# Patient Record
Sex: Male | Born: 1970 | Race: White | Hispanic: No | Marital: Married | Smoking: Former smoker
Health system: Southern US, Community
[De-identification: ages and names within clinical notes are randomized; demographics above are authoritative.]

## PROBLEM LIST (undated history)

## (undated) DIAGNOSIS — K603 Anal fistula, unspecified: Secondary | ICD-10-CM

## (undated) DIAGNOSIS — G4733 Obstructive sleep apnea (adult) (pediatric): Secondary | ICD-10-CM

## (undated) DIAGNOSIS — M1990 Unspecified osteoarthritis, unspecified site: Secondary | ICD-10-CM

## (undated) DIAGNOSIS — D751 Secondary polycythemia: Secondary | ICD-10-CM

## (undated) DIAGNOSIS — E291 Testicular hypofunction: Secondary | ICD-10-CM

## (undated) DIAGNOSIS — Z8719 Personal history of other diseases of the digestive system: Secondary | ICD-10-CM

## (undated) DIAGNOSIS — F419 Anxiety disorder, unspecified: Secondary | ICD-10-CM

## (undated) DIAGNOSIS — Z8711 Personal history of peptic ulcer disease: Secondary | ICD-10-CM

## (undated) DIAGNOSIS — K219 Gastro-esophageal reflux disease without esophagitis: Secondary | ICD-10-CM

## (undated) HISTORY — DX: Gastro-esophageal reflux disease without esophagitis: K21.9

## (undated) HISTORY — PX: NASAL SINUS SURGERY: SHX719

## (undated) HISTORY — PX: KNEE ARTHROSCOPY: SUR90

---

## 1999-12-10 ENCOUNTER — Ambulatory Visit (HOSPITAL_COMMUNITY): Admission: RE | Admit: 1999-12-10 | Discharge: 1999-12-10 | Payer: Self-pay | Admitting: Internal Medicine

## 1999-12-10 ENCOUNTER — Encounter: Payer: Self-pay | Admitting: Internal Medicine

## 1999-12-11 ENCOUNTER — Emergency Department (HOSPITAL_COMMUNITY): Admission: EM | Admit: 1999-12-11 | Discharge: 1999-12-11 | Payer: Self-pay | Admitting: *Deleted

## 2003-01-01 ENCOUNTER — Encounter: Admission: RE | Admit: 2003-01-01 | Discharge: 2003-01-01 | Payer: Self-pay | Admitting: Internal Medicine

## 2003-01-01 ENCOUNTER — Encounter: Payer: Self-pay | Admitting: Internal Medicine

## 2003-05-31 ENCOUNTER — Ambulatory Visit (HOSPITAL_COMMUNITY): Admission: RE | Admit: 2003-05-31 | Discharge: 2003-05-31 | Payer: Self-pay | Admitting: Internal Medicine

## 2003-11-29 ENCOUNTER — Encounter: Admission: RE | Admit: 2003-11-29 | Discharge: 2003-11-29 | Payer: Self-pay | Admitting: Internal Medicine

## 2004-02-03 ENCOUNTER — Encounter: Admission: RE | Admit: 2004-02-03 | Discharge: 2004-02-03 | Payer: Self-pay | Admitting: Internal Medicine

## 2004-05-22 ENCOUNTER — Ambulatory Visit (HOSPITAL_COMMUNITY): Admission: RE | Admit: 2004-05-22 | Discharge: 2004-05-22 | Payer: Self-pay | Admitting: Otolaryngology

## 2004-05-22 ENCOUNTER — Ambulatory Visit (HOSPITAL_BASED_OUTPATIENT_CLINIC_OR_DEPARTMENT_OTHER): Admission: RE | Admit: 2004-05-22 | Discharge: 2004-05-22 | Payer: Self-pay | Admitting: Otolaryngology

## 2004-05-22 ENCOUNTER — Encounter (INDEPENDENT_AMBULATORY_CARE_PROVIDER_SITE_OTHER): Payer: Self-pay | Admitting: Specialist

## 2005-04-27 ENCOUNTER — Emergency Department (HOSPITAL_COMMUNITY): Admission: EM | Admit: 2005-04-27 | Discharge: 2005-04-27 | Payer: Self-pay | Admitting: Emergency Medicine

## 2008-02-09 ENCOUNTER — Ambulatory Visit (HOSPITAL_COMMUNITY): Admission: RE | Admit: 2008-02-09 | Discharge: 2008-02-10 | Payer: Self-pay | Admitting: Otolaryngology

## 2008-02-09 ENCOUNTER — Encounter (INDEPENDENT_AMBULATORY_CARE_PROVIDER_SITE_OTHER): Payer: Self-pay | Admitting: Otolaryngology

## 2008-02-09 ENCOUNTER — Encounter: Admission: RE | Admit: 2008-02-09 | Discharge: 2008-02-09 | Payer: Self-pay | Admitting: Otolaryngology

## 2008-02-09 HISTORY — PX: FUNCTIONAL ENDOSCOPIC SINUS SURGERY: SUR616

## 2008-02-25 ENCOUNTER — Emergency Department (HOSPITAL_COMMUNITY): Admission: EM | Admit: 2008-02-25 | Discharge: 2008-02-25 | Payer: Self-pay | Admitting: Emergency Medicine

## 2009-02-21 ENCOUNTER — Encounter: Admission: RE | Admit: 2009-02-21 | Discharge: 2009-02-21 | Payer: Self-pay | Admitting: Internal Medicine

## 2009-05-19 ENCOUNTER — Encounter: Admission: RE | Admit: 2009-05-19 | Discharge: 2009-05-19 | Payer: Self-pay | Admitting: Internal Medicine

## 2009-06-16 ENCOUNTER — Encounter: Admission: RE | Admit: 2009-06-16 | Discharge: 2009-06-16 | Payer: Self-pay | Admitting: Internal Medicine

## 2009-06-17 ENCOUNTER — Encounter: Admission: RE | Admit: 2009-06-17 | Discharge: 2009-06-17 | Payer: Self-pay | Admitting: Internal Medicine

## 2010-01-14 ENCOUNTER — Emergency Department (HOSPITAL_COMMUNITY): Admission: EM | Admit: 2010-01-14 | Discharge: 2010-01-14 | Payer: Self-pay | Admitting: Emergency Medicine

## 2010-01-14 ENCOUNTER — Encounter: Payer: Self-pay | Admitting: Internal Medicine

## 2010-06-11 ENCOUNTER — Encounter (INDEPENDENT_AMBULATORY_CARE_PROVIDER_SITE_OTHER): Payer: Self-pay | Admitting: *Deleted

## 2010-07-28 ENCOUNTER — Ambulatory Visit: Payer: Self-pay | Admitting: Internal Medicine

## 2010-07-28 ENCOUNTER — Encounter (INDEPENDENT_AMBULATORY_CARE_PROVIDER_SITE_OTHER): Payer: Self-pay | Admitting: *Deleted

## 2010-07-28 DIAGNOSIS — R1013 Epigastric pain: Secondary | ICD-10-CM

## 2010-07-28 DIAGNOSIS — K3189 Other diseases of stomach and duodenum: Secondary | ICD-10-CM | POA: Insufficient documentation

## 2010-07-28 DIAGNOSIS — Z8711 Personal history of peptic ulcer disease: Secondary | ICD-10-CM | POA: Insufficient documentation

## 2010-07-28 DIAGNOSIS — R12 Heartburn: Secondary | ICD-10-CM | POA: Insufficient documentation

## 2010-08-06 ENCOUNTER — Ambulatory Visit: Payer: Self-pay | Admitting: Internal Medicine

## 2010-08-10 ENCOUNTER — Telehealth: Payer: Self-pay | Admitting: Internal Medicine

## 2010-11-24 NOTE — Assessment & Plan Note (Signed)
Summary: gas pain, indigestions meds not helping....em   History of Present Illness Visit Type: Initial Consult Primary GI MD: Stan Head MD [facility] Primary Provider: Nila Nephew, MD Requesting Provider: Nila Nephew, MD Chief Complaint: Tanner Hatfield History of Present Illness:   Patient states that he has a hx of gastric ulcers and saw a GI MD in the hospital in 1991, but none since. He states that he has had an increase in his reflux mostly at night while sleeping. He tried Protonix and did not feel like it did anything but cause more bloating and gas. Patient is currently wearing a heart moniter for 30 days, Dr Viann Fish.   "I have had ulcers since teens". Upper Gi series used to diagnose. he was treated with H2 blockers and then took H. pylori therapy in 20's. did well with no meds x years. Then in recent times 3-4 years intermittent Zantac OTC was used, helping upset stomach symptoms and gnawing ache in anbdomen. He was retested for H. pylori but titer not highe enough. Protonx tried with not much help and led to blating and gas. He stopped it.   GI Review of Systems    Reports acid reflux.      Denies abdominal pain, belching, bloating, chest pain, dysphagia with liquids, dysphagia with solids, heartburn, loss of appetite, nausea, vomiting, vomiting blood, weight loss, and  weight gain.        Denies anal fissure, black tarry stools, change in bowel habit, constipation, diarrhea, diverticulosis, fecal incontinence, heme positive stool, hemorrhoids, irritable bowel syndrome, jaundice, light color stool, liver problems, rectal bleeding, and  rectal pain. Preventive Screening-Counseling & Management  Alcohol-Tobacco     Smoking Status: quit    Current Medications (verified): 1)  Zantac 75 75 Mg Tabs (Ranitidine Hcl) .... Take One By Mouth As Needed 2)  Klonopin 0.5 Mg Tabs (Clonazepam) .... Take One By Mouth Two Times A Day 3)  Zyrtec Allergy 10 Mg Tabs (Cetirizine Hcl) .... Take One By  Mouth As Needed 4)  Depo-Testosterone 200 Mg/ml Oil (Testosterone Cypionate) .... 2cc Intramuscular Every 2 Weeks  Allergies (verified): No Known Drug Allergies  Past History:  Past Medical History: Anxiety Disorder Arrhythmia Hemorrhoids Hyperlipidemia Gastric ulcer H. 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  Alcohol Use - yes Daily Caffeine Use 1 per week Smoking Status:  quit  Review of Systems       The patient complains of allergy/sinus and anxiety-new.         All other ROS negative except as per HPI.   Vital Signs:  Patient profile:   40 year old male Height:      73.5 inches Weight:      214.8 pounds BMI:     28.06 Pulse rate:   88 / minute Pulse rhythm:   regular BP sitting:   122 / 78  (left arm) Cuff size:   regular  Vitals Entered By: Harlow Mares CMA Duncan Dull) (July 28, 2010 3:30 PM)  Physical Exam  General:  Well developed, well nourished, no acute distress. Eyes:  PERRLA, no icterus. Mouth:  No deformity or lesions, dentition normal. Neck:  Supple; no masses or  thyromegaly. Lungs:  Clear throughout to auscultation. Heart:  Regular rate and rhythm; no murmurs, rubs,  or bruits. Abdomen:  Soft, nontender and nondistended. No masses, hepatosplenomegaly or hernias noted. Normal bowel sounds. Extremities:  No clubbing, cyanosis, edema or deformities noted. Neurologic:  Alert and  oriented x3 Psych:  Alert and cooperative. Normal mood and affect.   Impression & Recommendations:  Problem # 1:  DYSPEPSIA (ICD-536.8) Assessment New ? PUD, gastritis, reflux, functional GI problem (i.e. he may have  non-ulcer dyspepsia) Has tried multiple PPI's as well as H2 blockers sounds like treatment for H. pylori serology helped at one point, recent subsequent titers negative per patient Orders: EGD (EGD) Risks, benefits,and indications of endoscopic procedure(s) were reviewed with the patient and all questions answered. assessment of mucosa with possible biopsies will help sort out and determine Tx  Problem # 2:  HEARTBURN (ICD-787.1) Assessment: New Some reflux, it seems. Not classic, however.  Orders: EGD (EGD)  Problem # 3:  GASTRIC ULCER, HX OF (ICD-V12.71) Assessment: New  Problem # 4:  HELICOBACTER PYLORI INFECTION, HX OF (ICD-V12.71) Assessment: New  Patient Instructions: 1)  We will see you at your procedure on 08/06/10. 2)  [facility] Patient Information Guide given to patient.  3)  Upper Endoscopy brochure given.  4)  Copy sent to : Nila Nephew, MD 5)  The medication list was reviewed and reconciled.  All changed / newly prescribed medications were explained.  A complete medication list was provided to the patient / caregiver.

## 2010-11-24 NOTE — Consult Note (Signed)
Summary: Rectal Pain    NAME:  Tanner Hatfield, Tanner Hatfield              ACCOUNT NO.:  [id number]      MEDICAL RECORD NO.:  [id number]          PATIENT TYPE:  EMS      LOCATION:  ED                           FACILITY:  [facility]      PHYSICIAN:  Lorne Skeens. Hoxworth, M.D.DATE OF BIRTH:  02-05-1971      DATE OF CONSULTATION:  01/14/2010   DATE OF DISCHARGE:                                    CONSULTATION      CHIEF COMPLAINT:  Rectal pain.      HISTORY:  I was asked by Dr. Elmore Guise to evaluate Mr. Raine.  He is a   generally healthier 40 year old male who presents with approximately 5   days of anorectal pain.  The pain was apparently gradual in onset   without any particular event associated with the onset.  He describes   burning and sharp pain in his anus and rectum which he indicates is more   posterior than anterior.  The pain has been waxing and waning over 5   days although is always present and gets severe at times.  It is worse   with bowel movements and with straining such as lifting or urination.   He has not noted any bleeding.  He thinks there may have been a little   discharge at times, but he is not sure of this.  The patient does have a   history of thrombosed hemorrhoid some years ago but no chronic anorectal   complaints.  No abdominal pain.  No nausea, vomiting.  No fever or   chills.  He was seen in Dr. Thomasene Lot office today who was concerned over   possible rectal abscess, and the patient was seen in the emergency room   for evaluation.      PAST MEDICAL HISTORY:   1. The patient has some history of dyspepsia and reflux.   2. Also some anxiety and panic attacks.   3. Seasonal allergies.      MEDICATIONS:   1. Klonopin 0.25 twice daily.   2. Testosterone once a week.   3. Zyrtec D.      ALLERGIES:  NO KNOWN DRUG ALLERGIES.      SOCIAL HISTORY:  He is married.  Works as a Psychologist, forensic.   He does not smoke cigarettes or drink alcohol.      PHYSICAL  EXAMINATION:  VITAL SIGNS:  He is afebrile, heart rate 86,   blood pressure 141/94, respirations 18.  GENERAL:  Well-developed male   who does not appear in any acute distress.  Pertinent findings on the   abdomen and rectal exam:   ABDOMEN:  Soft, nontender.  No masses or hernias.   RECTAL:  There are no external abnormalities.  Specifically no erythema,   swelling or drainage.  No external hemorrhoids.  On external exam, there   is some tenderness posteriorly.  Again, no induration.  Digital rectal   exam:  He is tender posteriorly.  I do not feel any mass, induration or   fluctuance.  No blood on examining finger.      LABORATORY DATA:  CBC in the emergency room shows a white count of 8000   with normal differential.  Hemoglobin 18.3, platelet count 216.      CT scan of the pelvis and rectum were obtained from the emergency room.   This is negative for any evidence of abscess, inflammatory change or   other abnormality.      ASSESSMENT/PLAN:  A 40 year old with 5 days of persistent rectal pain.   I find no evidence for abscess on physical exam nor is there any   evidence of infection in terms of fever, elevated white count and his CT   is normal.  He does not have bleeding typical for fissure, but I think   anal fissure is the most likely working diagnosis at this point.  I am   going to treat him with diltiazem cream as well as pain medications and   then topical anesthetic.  He is to call me for any worsening symptoms.   Otherwise, I will follow him in the office in 1 week.  If he worsens or   fails to improve, he will need an exam under anesthesia.               Lorne Skeens. Hoxworth, M.D.            Tory Emerald  D:  01/14/2010  T:  01/14/2010  Job:  [id number]      cc:   Erskine Speed, M.D.   Fax: [phone]      Electronically Signed by Glenna Fellows M.D. on 01/16/2010 09:53:35 AM

## 2010-11-24 NOTE — Letter (Signed)
Summary: EGD Instructions  [facility]  [address] [place], [place] [address]   Phone: [phone]  Fax: [phone]       Tanner Hatfield    January 08, 1971    MRN: [id number]      Procedure Day Dorna Bloom: Lenor Coffin, 08/06/10     Arrival Time: 1:30 PM     Procedure Time: 2:30 PM     Location of Procedure:                    _X_ [facility] (4th Floor)  PREPARATION FOR ENDOSCOPY   On THURSDAY, 08/06/10, THE DAY OF THE PROCEDURE:  1.   No solid foods, milk or milk products are allowed after midnight the night before your procedure.  2.   Do not drink anything colored red or purple.  Avoid juices with pulp.  No orange juice.  3.  You may drink clear liquids until 12:30 PM, which is 2 hours before your procedure.                                                                                                CLEAR LIQUIDS INCLUDE: Water Jello Ice Popsicles Tea (sugar ok, no milk/cream) Powdered fruit flavored drinks Coffee (sugar ok, no milk/cream) Gatorade Juice: apple, white grape, white cranberry  Lemonade Clear bullion, consomm, broth Carbonated beverages (any kind) Strained chicken noodle soup Hard Candy   MEDICATION INSTRUCTIONS  Unless otherwise instructed, you should take regular prescription medications with a small sip of water as early as possible the morning of your procedure.                  OTHER INSTRUCTIONS  You will need a responsible adult at least 39 years of age to accompany you and drive you home.   This person must remain in the waiting room during your procedure.  Wear loose fitting clothing that is easily removed.  Leave jewelry and other valuables at home.  However, you may wish to bring a book to read or an iPod/MP3 player to listen to music as you wait for your procedure to start.  Remove all body piercing jewelry and leave at home.  Total time from sign-in until discharge is approximately 2-3  hours.  You should go home directly after your procedure and rest.  You can resume normal activities the day after your procedure.  The day of your procedure you should not:   Drive   Make legal decisions   Operate machinery   Drink alcohol   Return to work  You will receive specific instructions about eating, activities and medications before you leave.    The above instructions have been reviewed and explained to me by   _______________________    I fully understand and can verbalize these instructions _____________________________ Date _________

## 2010-11-24 NOTE — Miscellaneous (Signed)
Summary: Samples Dexilant  Clinical Lists Changes  Medications: Added new medication of DEXILANT 60 MG CPDR (DEXLANSOPRAZOLE) take one by mouth once daily 

## 2010-11-24 NOTE — Progress Notes (Signed)
Summary: CLO negative  Phone Note Outgoing Call   Summary of Call: Let him no H. pylori is negative ask how Dexilant is helping or not? Iva Boop MD, [facility]  August 10, 2010 6:26 AM   Follow-up for Phone Call        LM to [facility] at home number (which is cell) Francee Piccolo CMA Duncan Dull)  August 11, 2010 10:02 AM   Advised pt of Neg H Pylori results.  Pt states Dexilant worked well.  He ran out of samples yesterday.  Do you want to send in RX? Follow-up by: Francee Piccolo CMA Duncan Dull),  August 11, 2010 3:08 PM  Additional Follow-up for Phone Call Additional follow up Details #1::        yes 60 mg daily #30 11 refills get him a discount card also follow-up GI as needed cc Dr. Elmore Guise on this Additional Follow-up by: Iva Boop MD, Clement[facility] Graham,  August 12, 2010 9:04 AM    Additional Follow-up for Phone Call Additional follow up Details #2::    rx sent.  discount card mailed to pt. Follow-up by: Francee Piccolo CMA Duncan Dull),  August 12, 2010 1:11 PM  New/Updated Medications: DEXILANT 60 MG CPDR (DEXLANSOPRAZOLE) take one by mouth once daily Prescriptions: DEXILANT 60 MG CPDR (DEXLANSOPRAZOLE) take one by mouth once daily  #30 x 11   Entered by:   Francee Piccolo CMA (AAMA)   Authorized by:   Iva Boop MD, [facility]   Signed by:   Francee Piccolo CMA (AAMA) on 08/12/2010   Method used:   Electronically to        [facility] (retail)       [address]       [place], [place]  [address]       Ph: [phone]       Fax: [phone]   RxID:   [id number]

## 2010-11-24 NOTE — Miscellaneous (Signed)
Summary: clotest  Clinical Lists Changes  Orders: Added new Test order of TLB-H Pylori Screen Gastric Biopsy (83013-CLOTEST) - Signed 

## 2010-11-24 NOTE — Letter (Signed)
Summary: New Patient letter  [facility]  [address] [place], [place] [address]   Phone: [phone]  Fax: [phone]       06/11/2010 MRN: [id number]  Tanner Hatfield [address] [place], [place]  [address]  Dear Tanner Hatfield,  Welcome to the Gastroenterology Division at [facility].    You are scheduled to see Dr.  Stan Head on July 28, 2010 at 3:00pm on the 3rd floor at [facility], [address]  We ask that you try to arrive at our office 15 minutes prior to your appointment time to allow for check-in.  We would like you to complete the enclosed self-administered evaluation form prior to your visit and bring it with you on the day of your appointment.  We will review it with you.  Also, please bring a complete list of all your medications or, if you prefer, bring the medication bottles and we will list them.  Please bring your insurance card so that we may make a copy of it.  If your insurance requires a referral to see a specialist, please bring your referral form from your primary care physician.  Co-payments are due at the time of your visit and may be paid by cash, check or credit card.     Your office visit will consist of a consult with your physician (includes a physical exam), any laboratory testing he/she may order, scheduling of any necessary diagnostic testing (e.g. x-ray, ultrasound, CT-scan), and scheduling of a procedure (e.g. Endoscopy, Colonoscopy) if required.  Please allow enough time on your schedule to allow for any/all of these possibilities.    If you cannot keep your appointment, please call [phone] to cancel or reschedule prior to your appointment date.  This allows [place] the opportunity to schedule an appointment for another patient in need of care.  If you do not cancel or reschedule by 5 p.m. the business day prior to your appointment date, you will be charged a $50.00 late cancellation/no-show fee.    Thank you for  choosing [facility] for your medical needs.  We appreciate the opportunity to care for you.  Please visit [place] at our website  to learn more about our practice.                     Sincerely,                                                             The Gastroenterology Division

## 2010-11-24 NOTE — Procedures (Signed)
Summary: Upper Endoscopy: Normal and No H. pylori  Patient: Tanner Hatfield Note: All result statuses are Final unless otherwise noted.  Tests: (1) Upper Endoscopy (EGD)   EGD Upper Endoscopy       DONE (C)     [facility]     [address]     [place], [place]  [address]           ENDOSCOPY PROCEDURE REPORT           PATIENT:  Tanner Hatfield, Tanner Hatfield  MR#:  [id number]     BIRTHDATE:  August 06, 1971, 39 yrs. old  GENDER:  male           ENDOSCOPIST:  Iva Boop, MD, [facility]     Referred by:  Nila Nephew, M.D.           PROCEDURE DATE:  08/06/2010     PROCEDURE:  EGD with biopsy     ASA CLASS:  Class I     INDICATIONS:  dyspepsia, heartburn prior H. pylori and history of     peptic ulcer disease           MEDICATIONS:   Fentanyl 50 mcg IV, Versed 8 mg IV, Benadryl 12.5     mg IV 2 mg Versed and bendaryl were premeds in prep area     TOPICAL ANESTHETIC:  Exactacain Spray           DESCRIPTION OF PROCEDURE:   After the risks benefits and     alternatives of the procedure were thoroughly explained, informed     consent was obtained.  The LB [id number] endoscope was     introduced through the mouth and advanced to the second portion of     the duodenum, without limitations.  The instrument was slowly     withdrawn as the mucosa was fully examined.     <<PROCEDUREIMAGES>>           The upper, middle, and distal third of the esophagus were     carefully inspected and no abnormalities were noted. The z-line     was well seen at the GEJ. The endoscope was pushed into the fundus     which was normal including a retroflexed view. The antrum,gastric     body, first and second part of the duodenum were unremarkable.  A     biopsy for H. pylori was taken.    Retroflexed views revealed no     abnormalities.    The scope was then withdrawn from the patient     and the procedure completed.           COMPLICATIONS:  None           ENDOSCOPIC IMPRESSION:     1) Normal EGD - suspect  non-ulcer dyspepsia.     RECOMMENDATIONS:     1) Await H. pylori biopsy results     2) Treat H. pylori if +.     3) May need deep sedation with next procedure. Significant     pre-procedure anxiety.           REPEAT EXAM:  as needed           ADDENDUM: Will start Dexilant 60 mg daily. Having abdominal pain     in recovery but same as pain in past and abdomen is soft and     benign. I think there is an underlying anxiety component to his     problems.  Iva Boop, MD, [facility] Graham           CC:  Nila Nephew, MD     Viann Fish, MD     The Patient           n.     REVISED:  08/06/2010 03:35 PM     eSIGNED:   Iva Boop at 08/06/2010 03:35 PM           Birdena Crandall, [id number]  Note: An exclamation mark (!) indicates a result that was not dispersed into the flowsheet. Document Creation Date: 08/06/2010 3:35 PM _______________________________________________________________________  (1) Order result status: Final Collection or observation date-time: 08/06/2010 15:01 Requested date-time:  Receipt date-time:  Reported date-time:  Referring Physician:   Ordering Physician: Stan Head [phone]) Specimen Source:  Source: Launa Grill Order Number: [id number] Lab site:   Appended Document: Upper Endoscopy NORMAL H. Pylori CLO negative   EGD  Procedure date:  08/06/2010  Findings:      1) Normal EGD - suspect non-ulcer dyspepsia.     RECOMMENDATIONS:     1) Await H. pylori biopsy results NEGATIVE     2) Treat H. pylori if +.     3) May need deep sedation with next procedure. Significant     pre-procedure anxiety.    4) trial of Dexilant 60 mg once daily

## 2010-12-20 ENCOUNTER — Emergency Department (HOSPITAL_COMMUNITY)
Admission: EM | Admit: 2010-12-20 | Discharge: 2010-12-20 | Disposition: A | Discharge status: Home / Self Care | Payer: 59 | Attending: Emergency Medicine | Admitting: Emergency Medicine

## 2010-12-20 ENCOUNTER — Emergency Department (HOSPITAL_COMMUNITY): Payer: 59

## 2010-12-20 DIAGNOSIS — M549 Dorsalgia, unspecified: Secondary | ICD-10-CM | POA: Insufficient documentation

## 2010-12-20 DIAGNOSIS — J90 Pleural effusion, not elsewhere classified: Secondary | ICD-10-CM | POA: Insufficient documentation

## 2010-12-20 DIAGNOSIS — E785 Hyperlipidemia, unspecified: Secondary | ICD-10-CM | POA: Insufficient documentation

## 2010-12-20 DIAGNOSIS — K449 Diaphragmatic hernia without obstruction or gangrene: Secondary | ICD-10-CM | POA: Insufficient documentation

## 2010-12-20 DIAGNOSIS — R112 Nausea with vomiting, unspecified: Secondary | ICD-10-CM | POA: Insufficient documentation

## 2010-12-20 DIAGNOSIS — R1031 Right lower quadrant pain: Secondary | ICD-10-CM | POA: Insufficient documentation

## 2010-12-20 LAB — COMPREHENSIVE METABOLIC PANEL
Alkaline Phosphatase: 29 U/L — ABNORMAL LOW (ref 39–117)
CO2: 26 mEq/L (ref 19–32)
GFR calc Af Amer: >60 mL/min (ref >60)
Glucose, Bld: 95 mg/dL (ref 70–99)
Potassium: 4 mEq/L (ref 3.5–5.1)
Sodium: 133 mEq/L — ABNORMAL LOW (ref 135–145)

## 2010-12-20 LAB — DIFFERENTIAL
Basophils Absolute: 0 10*3/uL (ref 0.0–0.1)
Basophils Relative: 0 % (ref 0–1)
Eosinophils Relative: 0 % (ref 0–5)
Monocytes Absolute: 0.6 10*3/uL (ref 0.1–1.0)
Monocytes Relative: 6 % (ref 3–12)
Neutrophils Relative %: 82 % — ABNORMAL HIGH (ref 43–77)

## 2010-12-20 LAB — URINALYSIS, ROUTINE W REFLEX MICROSCOPIC
Nitrite: NEGATIVE
Protein, ur: NEGATIVE mg/dL
Specific Gravity, Urine: 1.011 (ref 1.005–1.030)
Urobilinogen, UA: 0.2 mg/dL (ref 0.0–1.0)
pH: 7 (ref 5.0–8.0)

## 2010-12-20 LAB — CBC
HCT: 56.9 % — ABNORMAL HIGH (ref 39.0–52.0)
Hemoglobin: 19.9 g/dL — ABNORMAL HIGH (ref 13.0–17.0)
MCH: 30.9 pg (ref 26.0–34.0)
MCHC: 35 g/dL (ref 30.0–36.0)
MCV: 88.5 fL (ref 78.0–100.0)

## 2010-12-20 LAB — LIPASE, BLOOD: Lipase: 40 U/L (ref 11–59)

## 2010-12-20 MED ORDER — IOHEXOL 300 MG/ML  SOLN
100.0000 mL | Freq: Once | INTRAMUSCULAR | Status: AC | PRN
  Administered 2010-12-20: 100 mL via INTRAVENOUS

## 2010-12-24 ENCOUNTER — Ambulatory Visit
Admission: RE | Admit: 2010-12-24 | Discharge: 2010-12-24 | Disposition: A | Discharge status: Home / Self Care | Payer: 59 | Source: Ambulatory Visit | Attending: Family Medicine | Admitting: Family Medicine

## 2010-12-24 ENCOUNTER — Other Ambulatory Visit: Payer: Self-pay | Admitting: Family Medicine

## 2011-01-18 LAB — CBC
HCT: 55.1 % — ABNORMAL HIGH (ref 39.0–52.0)
Platelets: 216 10*3/uL (ref 150–400)
RBC: 5.91 MIL/uL — ABNORMAL HIGH (ref 4.22–5.81)

## 2011-01-22 ENCOUNTER — Encounter (HOSPITAL_COMMUNITY): Payer: 59

## 2011-01-22 ENCOUNTER — Encounter (HOSPITAL_COMMUNITY): Payer: 59 | Attending: Internal Medicine

## 2011-01-22 DIAGNOSIS — D45 Polycythemia vera: Secondary | ICD-10-CM | POA: Insufficient documentation

## 2011-02-12 ENCOUNTER — Encounter (HOSPITAL_COMMUNITY): Payer: 59 | Attending: Internal Medicine

## 2011-02-12 ENCOUNTER — Other Ambulatory Visit: Payer: Self-pay | Admitting: Internal Medicine

## 2011-02-12 DIAGNOSIS — D45 Polycythemia vera: Secondary | ICD-10-CM | POA: Insufficient documentation

## 2011-02-12 LAB — CBC
Hemoglobin: 18.8 g/dL — ABNORMAL HIGH (ref 13.0–17.0)
Platelets: 226 10*3/uL (ref 150–400)
RBC: 5.94 MIL/uL — ABNORMAL HIGH (ref 4.22–5.81)
WBC: 5.9 10*3/uL (ref 4.0–10.5)

## 2011-03-09 NOTE — Op Note (Signed)
NAMECOLM, LYFORD              ACCOUNT NO.:  [id number]   MEDICAL RECORD NO.:  [id number]          PATIENT TYPE:  OIB   LOCATION:  5006                         FACILITY:  [facility]   PHYSICIAN:  Jefry H. 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   PROCEDURE:  Right endoscopic frontal sinusotomy and removal of right  ethmoid mucocele.   SURGEON:  Jefry H. Pollyann Kennedy, MD   ANESTHESIA:  General endotracheal anesthesia was used.   COMPLICATIONS:  None.   ESTIMATED BLOOD LOSS:  Minimal.   FINDINGS:  Large obstructed ethmoid cell with complete obstruction of  the frontal sinus.  The frontal sinus on the right side has a central  portion and a more lateral portion above the orbit.  There are 2  separate drainage pathways for these separated by a bony septum.  Both  of these were obstructed.  The medial portion was filled with thick  purulent material.   HISTORY:  This is a 40 year old gentleman who underwent an extensive  sinus surgery about 3 years ago.  He started having severe pain and  swelling around the right eye yesterday and on imaging, it was found to  have what appeared to be an expansile mass, possibly a mucocele on the  right ethmoid and with extension of disease into the frontal sinuses.  Risks, benefits, alternatives, and complications of the procedure were  explained to the patient.  He understand and agreed to surgery.   PROCEDURE:  The patient was taken to the operating room, placed on the  operating table in supine position.  Following induction of general  endotracheal anesthesia, the patient was prepped and draped in standard  fashion.  Afrin spray was used preoperatively.  The 30  and 70-degree  nasal endoscopes were used for the procedure.  Xylocaine with  epinephrine was infiltrated into the mucosa overlying the ethmoid cell.  A straight suction was then used to open up this cell into a large air  containing space.  The walls of the cell were completely taken down  keeping the lamina papyracea intact.  Further inspection up into the  frontal recess revealed obstructing material, which was also cleaned out  using appropriate angled forceps.  The secondary frontal ostium was  identified and the suction was entered and material was cleaned out from  the sinus.  The openings were enlarged taking care not to damage the  fovea.  Using transillumination, I was able to get very bright  transillumination of the entire frontal sinus in both portions including  the medial portion.  Afrin-soaked pledgets were then placed and removed  just prior to extubation.  The patient was then awakened, extubated, and  transferred to recovery in stable condition.      Jefry H. Pollyann Kennedy, MD  Electronically Signed  JHR/MEDQ  D:  02/09/2008  T:  02/10/2008  Job:  [id number]

## 2011-03-12 NOTE — Op Note (Signed)
NAME:  Tanner Hatfield, Tanner Hatfield                        ACCOUNT NO.:  [id number]   MEDICAL RECORD NO.:  [id number]                   PATIENT TYPE:  AMB   LOCATION:  DSC                                  FACILITY:  [facility]   PHYSICIAN:  Jefry H. Pollyann Kennedy, M.D.                DATE OF BIRTH:  22-May-1971   DATE OF PROCEDURE:  05/22/2004  DATE OF DISCHARGE:                                 OPERATIVE REPORT   PREOPERATIVE DIAGNOSIS:  1. Chronic nasal and sinus polyposis.  2. Chronic frontal sinusitis bilateral.  3. Chronic maxillary sinusitis bilaterally.   POSTOPERATIVE DIAGNOSIS:  1. Chronic nasal and sinus polyposis.  2. Chronic frontal sinusitis bilateral.  3. Chronic maxillary sinusitis bilaterally.   PROCEDURE:  1. Extensive nasal and sinus polypectomy bilateral and endoscopic.  2. Revision bilateral frontal sinusotomy.  3. Revision bilateral maxillary antrostomy with removal of polypoid tissue     and mucous retention cysts from the maxillary sinuses bilaterally.   SURGEON:  Jefry H. Pollyann Kennedy, M.D.   ANESTHESIA:  General endotracheal anesthesia.   COMPLICATIONS:  None.   ESTIMATED BLOOD LOSS:  150 mL.   FINDINGS:  Diffuse nasal and ethmoid sinus polyposis, bilateral maxillary  sinus obstruction with bilateral retention cysts and hyperplastic mucosa  with polypoid degeneration in the maxillary sinuses.  Bilateral frontal  sinus obstruction with purulent secretions found on both sides.   SPECIMENS:  Nasal and sinus contents, and second specimen was frontal sinus  contents for culture and sensitivity.   DISPOSITION:  The patient tolerated the procedure well, was awakened,  extubated, and transferred to the recovery room in stable condition.   HISTORY:  This is a 40 year old who developed new onset chronic sinus  infection this past year and underwent extensive endoscopic sinus surgery  back in April.  He initially did well but then started having chronic  infection with chronic frontal  sinus opacification.  He also had  intermittent swelling of the forehead and around the left eye that was of  unclear etiology.  The risks, benefits, alternatives, and complications of  the procedure were explained to the patient who seemed to understand and  agreed to the surgery.   PROCEDURE:  The patient was taken to the operating room and placed on the  operating table in supine position.  Following induction of general  endotracheal anesthesia, the face was prepped and draped in a standard  fashion.  Oxymetazoline spray was used preoperatively in the nasal cavities.  1% Xylocaine with epinephrine was infiltrated into the superior and  posterior attachments of the middle turbinates and the lateral nasal wall  bilaterally.  A total of about 7 mL was injected.   1. Extensive nasal and sinus polypectomy.  Using the 0 and 30 degree nasal     endoscopes and multiple different sinus forceps as well as the     microdebrider, a complete ethmoid cavity polyp clean out was  performed on     both sides.  Anterior and posterior cells were cleaned of polypoid and     hyperplastic mucosa.  The middle turbinates were near completely resected     bilaterally to allow better access to the ethmoid cavities.  Following     the clean out of the ethmoid cavities and removal of all polypoid tissue,     the attention was turned towards the maxillary sinuses.   1. Bilateral maxillary antrostomy with removal of maxillary sinus tissue.     The antrostomy sites were inspected bilaterally and were completely     filled with polypoid tissue which was cleaned out using the microdebrider     and various sinus forceps.  There was a large mucous cyst on the left     side that was opened and unroofed.  As much hyperplastic mucosa that     could be removed easily was removed from both sides.  There was some     purulent secretions on the left side that were suctioned, as well.   1. Bilateral revision frontal  sinusotomy.  After completing the ethmoid     polypectomy, the frontal recess was explored and cleaned of polypoid     tissue.  A large amount of purulent secretions were produced from the     frontal sinuses  and were collected and sent for culture and sensitivity.     The polypoid tissue was removed from the frontal recess and from within     the sinus, itself, using giraffe forceps.  After adequate opening and     exposure of the frontal sinus was performed, the frontal sinus was packed     with Cortisporin ointment.  The ethmoid and maxillary cavities were also     packed with Cortisporin ointment.  The nasal cavities were packed with     rolled up Telfa.  The pharynx was suctioned of blood and secretions.  The     patient was then awakened, extubated, and transferred to the recovery     room.                                               Jefry H. Pollyann Kennedy, M.D.    JHR/MEDQ  D:  05/22/2004  T:  05/22/2004  Job:  [id number]

## 2011-03-17 ENCOUNTER — Other Ambulatory Visit: Payer: Self-pay | Admitting: Internal Medicine

## 2011-03-17 ENCOUNTER — Encounter (HOSPITAL_COMMUNITY): Payer: 59 | Attending: Internal Medicine

## 2011-03-17 DIAGNOSIS — D45 Polycythemia vera: Secondary | ICD-10-CM | POA: Insufficient documentation

## 2011-03-17 LAB — CBC
HCT: 53.7 % — ABNORMAL HIGH (ref 39.0–52.0)
Hemoglobin: 19.6 g/dL — ABNORMAL HIGH (ref 13.0–17.0)
MCH: 31.9 pg (ref 26.0–34.0)
MCHC: 36.5 g/dL — ABNORMAL HIGH (ref 30.0–36.0)
MCV: 87.3 fL (ref 78.0–100.0)
RBC: 6.15 MIL/uL — ABNORMAL HIGH (ref 4.22–5.81)
RDW: 12.6 % (ref 11.5–15.5)
WBC: 6.7 10*3/uL (ref 4.0–10.5)

## 2011-04-02 ENCOUNTER — Encounter (HOSPITAL_COMMUNITY): Payer: 59 | Attending: Internal Medicine

## 2011-04-02 ENCOUNTER — Other Ambulatory Visit: Payer: Self-pay | Admitting: Internal Medicine

## 2011-04-02 DIAGNOSIS — D45 Polycythemia vera: Secondary | ICD-10-CM | POA: Insufficient documentation

## 2011-04-02 LAB — CBC
Hemoglobin: 17.9 g/dL — ABNORMAL HIGH (ref 13.0–17.0)
MCHC: 37.1 g/dL — ABNORMAL HIGH (ref 30.0–36.0)
MCV: 87.5 fL (ref 78.0–100.0)
Platelets: 227 10*3/uL (ref 150–400)

## 2011-04-02 LAB — DIFFERENTIAL
Basophils Absolute: 0 10*3/uL (ref 0.0–0.1)
Lymphs Abs: 1.8 10*3/uL (ref 0.7–4.0)
Monocytes Absolute: 0.5 10*3/uL (ref 0.1–1.0)
Monocytes Relative: 8 % (ref 3–12)
Neutro Abs: 3.3 10*3/uL (ref 1.7–7.7)
Neutrophils Relative %: 57 % (ref 43–77)

## 2011-04-23 ENCOUNTER — Encounter (HOSPITAL_COMMUNITY): Payer: 59

## 2011-04-23 ENCOUNTER — Other Ambulatory Visit: Payer: Self-pay | Admitting: Internal Medicine

## 2011-04-23 LAB — POCT HEMOGLOBIN-HEMACUE: Hemoglobin: 17 g/dL (ref 13.0–17.0)

## 2011-04-30 ENCOUNTER — Encounter (INDEPENDENT_AMBULATORY_CARE_PROVIDER_SITE_OTHER): Payer: Self-pay | Admitting: General Surgery

## 2011-04-30 ENCOUNTER — Ambulatory Visit (INDEPENDENT_AMBULATORY_CARE_PROVIDER_SITE_OTHER): Payer: Worker's Compensation | Admitting: General Surgery

## 2011-04-30 VITALS — BP 150/106 | HR 98 | Temp 98.1°F | Ht 74.0 in | Wt 211.6 lb

## 2011-04-30 DIAGNOSIS — R1030 Lower abdominal pain, unspecified: Secondary | ICD-10-CM | POA: Insufficient documentation

## 2011-04-30 DIAGNOSIS — R109 Unspecified abdominal pain: Secondary | ICD-10-CM

## 2011-04-30 NOTE — Progress Notes (Signed)
Subjective:     Patient ID: DUTCH ING, male   DOB: [birth year], 40 y.o.   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 Recently he's had the onset of left groin pain. He then got examined by his physician and was noted to also have right groin pain. He has testicular pain is associated with this. He has no other symptoms outside of the pain associated with this. It is made worse with activity. It is relieved with rest. He's had no trouble with his bowel movements or urination. He has no nausea and vomiting associated with it. He was possibly had a hernia and was referred for evaluation.  Review of Systems  Musculoskeletal: Positive for arthralgias.  All other systems reviewed and are negative.       Objective:   Physical Exam  Constitutional: He appears well-developed and well-nourished.  Abdominal: Soft. He exhibits no mass. There is no tenderness. There is no guarding. Hernia confirmed negative in the right inguinal area and confirmed negative in the left inguinal area.  Genitourinary: Testes normal. Right testis shows no mass, no swelling and no tenderness. Left testis shows no mass, no swelling and no tenderness.  Lymphadenopathy:       Right: No inguinal adenopathy present.       Left: No inguinal adenopathy present.       Assessment:     Bilateral groin pain    Plan:       He does not have hernias on either side on his examination. Furthermore his genitourinary examination did not show any source of his pain. I think he may very well have a musculoskeletal origin of his pain and we'll plan on treating that.

## 2011-05-05 ENCOUNTER — Telehealth (INDEPENDENT_AMBULATORY_CARE_PROVIDER_SITE_OTHER): Payer: Self-pay | Admitting: General Surgery

## 2011-07-20 LAB — CBC
Hemoglobin: 18.3 — ABNORMAL HIGH
MCHC: 34.5
RBC: 6.03 — ABNORMAL HIGH
RDW: 12.3

## 2013-02-12 ENCOUNTER — Inpatient Hospital Stay (HOSPITAL_COMMUNITY)
Admission: AD | Admit: 2013-02-12 | Discharge: 2013-02-14 | DRG: 349 | Disposition: A | Discharge status: Home / Self Care | Payer: 59 | Source: Ambulatory Visit | Attending: General Surgery | Admitting: General Surgery

## 2013-02-12 ENCOUNTER — Encounter (INDEPENDENT_AMBULATORY_CARE_PROVIDER_SITE_OTHER): Payer: Self-pay | Admitting: General Surgery

## 2013-02-12 ENCOUNTER — Encounter (INDEPENDENT_AMBULATORY_CARE_PROVIDER_SITE_OTHER): Payer: Self-pay

## 2013-02-12 ENCOUNTER — Encounter (HOSPITAL_COMMUNITY): Payer: Self-pay | Admitting: *Deleted

## 2013-02-12 ENCOUNTER — Ambulatory Visit (INDEPENDENT_AMBULATORY_CARE_PROVIDER_SITE_OTHER): Payer: 59 | Admitting: General Surgery

## 2013-02-12 ENCOUNTER — Other Ambulatory Visit (INDEPENDENT_AMBULATORY_CARE_PROVIDER_SITE_OTHER): Payer: Self-pay | Admitting: General Surgery

## 2013-02-12 VITALS — BP 138/82 | HR 84 | Temp 97.9°F | Resp 18 | Ht 74.0 in | Wt 204.0 lb

## 2013-02-12 DIAGNOSIS — M7710 Lateral epicondylitis, unspecified elbow: Secondary | ICD-10-CM | POA: Diagnosis present

## 2013-02-12 DIAGNOSIS — K612 Anorectal abscess: Secondary | ICD-10-CM

## 2013-02-12 DIAGNOSIS — Z8711 Personal history of peptic ulcer disease: Secondary | ICD-10-CM

## 2013-02-12 DIAGNOSIS — F411 Generalized anxiety disorder: Secondary | ICD-10-CM | POA: Diagnosis present

## 2013-02-12 DIAGNOSIS — K611 Rectal abscess: Secondary | ICD-10-CM

## 2013-02-12 DIAGNOSIS — IMO0002 Reserved for concepts with insufficient information to code with codable children: Secondary | ICD-10-CM

## 2013-02-12 LAB — URINALYSIS, ROUTINE W REFLEX MICROSCOPIC
Glucose, UA: NEGATIVE mg/dL
Hgb urine dipstick: NEGATIVE
Protein, ur: NEGATIVE mg/dL
pH: 7 (ref 5.0–8.0)

## 2013-02-12 LAB — CBC WITH DIFFERENTIAL/PLATELET
Eosinophils Absolute: 0 10*3/uL (ref 0.0–0.7)
Eosinophils Relative: 0 % (ref 0–5)
Hemoglobin: 16.7 g/dL (ref 13.0–17.0)
Lymphs Abs: 1 10*3/uL (ref 0.7–4.0)
MCH: 28.5 pg (ref 26.0–34.0)
MCV: 84.3 fL (ref 78.0–100.0)
Monocytes Relative: 7 % (ref 3–12)
Neutrophils Relative %: 82 % — ABNORMAL HIGH (ref 43–77)
RBC: 5.85 MIL/uL — ABNORMAL HIGH (ref 4.22–5.81)

## 2013-02-12 LAB — COMPREHENSIVE METABOLIC PANEL
AST: 19 U/L (ref 0–37)
Albumin: 3.9 g/dL (ref 3.5–5.2)
Calcium: 9.5 mg/dL (ref 8.4–10.5)
Creatinine, Ser: 1.25 mg/dL (ref 0.50–1.35)

## 2013-02-12 MED ORDER — MORPHINE SULFATE 4 MG/ML IJ SOLN: 2.0000 mg | INTRAMUSCULAR | Status: DC | PRN

## 2013-02-12 MED ORDER — ACETAMINOPHEN 650 MG RE SUPP: 650.0000 mg | Freq: Four times a day (QID) | RECTAL | Status: DC | PRN

## 2013-02-12 MED ORDER — MORPHINE SULFATE 2 MG/ML IJ SOLN
1.0000 mg | INTRAMUSCULAR | Status: DC | PRN
  Administered 2013-02-12 – 2013-02-13 (×3): 2 mg via INTRAVENOUS
  Administered 2013-02-13: 4 mg via INTRAVENOUS
  Administered 2013-02-13 – 2013-02-14 (×3): 2 mg via INTRAVENOUS
  Filled 2013-02-12 (×4): qty 1
  Filled 2013-02-12: qty 2
  Filled 2013-02-12 (×2): qty 1

## 2013-02-12 MED ORDER — OXYCODONE HCL 5 MG PO TABS
5.0000 mg | ORAL_TABLET | ORAL | Status: DC | PRN
  Administered 2013-02-12: 10 mg via ORAL
  Administered 2013-02-14: 5 mg via ORAL
  Administered 2013-02-14: 10 mg via ORAL
  Filled 2013-02-12 (×3): qty 2

## 2013-02-12 MED ORDER — CLONAZEPAM 0.5 MG PO TABS: 0.2500 mg | ORAL_TABLET | Freq: Two times a day (BID) | ORAL | Status: DC | PRN

## 2013-02-12 MED ORDER — PIPERACILLIN-TAZOBACTAM 3.375 G IVPB
3.3750 g | Freq: Three times a day (TID) | INTRAVENOUS | Status: DC
  Administered 2013-02-12 – 2013-02-14 (×6): 3.375 g via INTRAVENOUS
  Filled 2013-02-12 (×8): qty 50

## 2013-02-12 MED ORDER — SODIUM CHLORIDE 0.9 % IV SOLN: 3.0000 g | Freq: Four times a day (QID) | INTRAVENOUS | Status: DC

## 2013-02-12 MED ORDER — DIPHENHYDRAMINE HCL 12.5 MG/5ML PO ELIX: 12.5000 mg | ORAL_SOLUTION | Freq: Four times a day (QID) | ORAL | Status: DC | PRN

## 2013-02-12 MED ORDER — LORAZEPAM 2 MG/ML IJ SOLN: 1.0000 mg | Freq: Four times a day (QID) | INTRAMUSCULAR | Status: DC | PRN

## 2013-02-12 MED ORDER — POTASSIUM CHLORIDE IN NACL 20-0.9 MEQ/L-% IV SOLN
INTRAVENOUS | Status: DC
Start: 2013-02-12 — End: 2013-02-14
  Administered 2013-02-12: 1000 mL via INTRAVENOUS
  Administered 2013-02-13: 14:00:00 via INTRAVENOUS
  Filled 2013-02-12 (×3): qty 1000

## 2013-02-12 MED ORDER — FAMOTIDINE 20 MG PO TABS
20.0000 mg | ORAL_TABLET | Freq: Two times a day (BID) | ORAL | Status: DC
  Administered 2013-02-12 – 2013-02-14 (×4): 20 mg via ORAL
  Filled 2013-02-12 (×5): qty 1

## 2013-02-12 MED ORDER — SODIUM CHLORIDE 0.9 % IV SOLN: INTRAVENOUS | Status: DC

## 2013-02-12 MED ORDER — PIPERACILLIN-TAZOBACTAM 3.375 G IVPB 30 MIN: 3.3750 g | Freq: Three times a day (TID) | INTRAVENOUS | Status: DC

## 2013-02-12 MED ORDER — ONDANSETRON HCL 4 MG/2ML IJ SOLN
4.0000 mg | Freq: Four times a day (QID) | INTRAMUSCULAR | Status: DC | PRN
  Administered 2013-02-13 – 2013-02-14 (×2): 4 mg via INTRAVENOUS
  Filled 2013-02-12 (×2): qty 2

## 2013-02-12 MED ORDER — ACETAMINOPHEN 325 MG PO TABS: 650.0000 mg | ORAL_TABLET | Freq: Four times a day (QID) | ORAL | Status: DC | PRN

## 2013-02-12 MED ORDER — DIPHENHYDRAMINE HCL 50 MG/ML IJ SOLN: 12.5000 mg | Freq: Four times a day (QID) | INTRAMUSCULAR | Status: DC | PRN

## 2013-02-12 MED ORDER — HEPARIN SODIUM (PORCINE) 5000 UNIT/ML IJ SOLN
5000.0000 [IU] | Freq: Three times a day (TID) | INTRAMUSCULAR | Status: DC
  Administered 2013-02-12: 5000 [IU] via SUBCUTANEOUS
  Filled 2013-02-12 (×5): qty 1

## 2013-02-12 MED ORDER — DOCUSATE SODIUM 100 MG PO CAPS
100.0000 mg | ORAL_CAPSULE | Freq: Two times a day (BID) | ORAL | Status: DC
  Administered 2013-02-12 – 2013-02-14 (×3): 100 mg via ORAL
  Filled 2013-02-12 (×5): qty 1

## 2013-02-12 MED ORDER — ZOLPIDEM TARTRATE 5 MG PO TABS: 5.0000 mg | ORAL_TABLET | Freq: Every evening | ORAL | Status: DC | PRN

## 2013-02-12 MED ORDER — ONDANSETRON HCL 4 MG/2ML IJ SOLN: 4.0000 mg | Freq: Four times a day (QID) | INTRAMUSCULAR | Status: DC | PRN

## 2013-02-12 NOTE — Progress Notes (Signed)
Patient refused the 2200 heparin injection.

## 2013-02-12 NOTE — Progress Notes (Signed)
Dr Biagio Quint was notified of patient refusal of the heparin injection

## 2013-02-12 NOTE — Progress Notes (Signed)
Pt c/o pain between buttocks, more so on the Lt side. Appetite good. States had LBM- 4/21 x2. Urine sample obtained

## 2013-02-12 NOTE — Progress Notes (Signed)
Patient ID: Tanner Hatfield, male   DOB: 1971/04/14, 42 y.o.   MRN: [id number]  Chief Complaint  Patient presents with  . Abscess    HPI Tanner Hatfield is a 42 y.o. male.   HPI This is a 42 year old male I know from both his own care as well as his daughters. Since last Tuesday he has had mostly left-sided buttock pain. He is unable to sit. He has a history of an anal fissure as well as an occasional thrombosed hemorrhoid but this is different. This is gotten much worse and he woke up this weekend and felt like he was sitting on a softball. He's had no drainage but has had low-grade fevers at home. He states he has felt hot as well. This did not get any better and he presented to urgent care over the weekend was given antibiotics. He comes in today for no improvement. He is also been  on prednisone for the last 2 weeks due to some elbow tendinitis. He is having bowel movements. He had a full lunch consisting of a barbecue sandwich is drinking water in the room when I see him. Past Medical History  Diagnosis Date  . Ulcer   . GERD (gastroesophageal reflux disease)     Past Surgical History  Procedure Laterality Date  . Knee arthroscopy w/ acl reconstruction      right knee  . Functional endoscopic sinus surgery      scraped sinus    Family History  Problem Relation Age of Onset  . Heart disease Father     afib    Social History History  Substance Use Topics  . Smoking status: Former Games developer  . Smokeless tobacco: Not on file  . Alcohol Use: No    No Known Allergies  Current Outpatient Prescriptions  Medication Sig Dispense Refill  . clindamycin (CLEOCIN) 300 MG capsule Take 300 mg by mouth 3 (three) times daily.      . clonazePAM (KLONOPIN) 0.5 MG tablet Take 0.5 mg by mouth 2 (two) times daily as needed.        Marland Kitchen HYDROcodone-acetaminophen (NORCO/VICODIN) 5-325 MG per tablet Take 2 tablets by mouth every 6 (six) hours as needed for pain.      . metroNIDAZOLE (FLAGYL) 500  MG tablet Take 500 mg by mouth 2 (two) times daily.      . predniSONE (DELTASONE) 10 MG tablet Take 10 mg by mouth daily.       No current facility-administered medications for this visit.    Review of Systems Review of Systems  Constitutional: Positive for fever.  Gastrointestinal: Positive for rectal pain. Negative for diarrhea, constipation, blood in stool and anal bleeding.    Blood pressure 138/82, pulse 84, temperature 97.9 F (36.6 C), temperature source Oral, resp. rate 18, height 6\' 2"  (1.88 m), weight 204 lb (92.534 kg).  Physical Exam Physical Exam  Vitals reviewed. Constitutional: He appears well-developed and well-nourished.  Cardiovascular: Normal rate, regular rhythm and normal heart sounds.   Pulmonary/Chest: Effort normal. He has no wheezes. He has no rales.  Abdominal: Soft. Bowel sounds are normal. There is no tenderness.  Genitourinary: Rectal exam shows tenderness. Rectal exam shows no external hemorrhoid.       Data Reviewed Note from [facility]  Assessment    Perirectal abscess     Plan    It appears that he has perirectal abscess.  He is currently on prednisone and has been having low grade fevers  at home.  On abx but no better.  He will not really let me adequately examine him due to pain. He is drinking water right now and ate a full lunch. I recommended admission, iv abx, check labs and eua with drainage tomorrow.  Will discuss with hospital physician        [facility] 02/12/2013, 2:47 PM

## 2013-02-12 NOTE — H&P (Signed)
Tanner Hatfield is an 42 y.o. male.   Chief Complaint: Rectal pain PCP:  Dr. Nila Nephew  HPI: This is a 42 year old male known to Dr Dwain Sarna from both his own care as well as his daughters. Since last Tuesday he has had mostly left-sided buttock pain. He is unable to sit. He has a history of an anal fissure as well as an occasional thrombosed hemorrhoid but this is different. This is gotten much worse and he woke up this weekend Saturday 4/19, and felt like he was sitting on a softball. He's had no drainage but has had low-grade fevers at home. He states he has felt hot as well. This did not get any better and he presented to Urgent Care over the weekend was given antibiotics. He also spoke to Dr. Johna Sheriff over the phone. He comes in today for no improvement. He is also been on prednisone for the last 2 weeks due to some elbow tendinitis. He is having bowel movements. He had a full lunch consisting of a barbecue sandwich is drinking water in the room our Urgent Office when seen by Dr. Dwain Sarna. He was referred to the hospital for IV antibiotics, and EUA with possible perirectal abscess drainage in the AM.   Past Medical History  Diagnosis Date  . Ulcer   . GERD (gastroesophageal reflux disease)     Past Surgical History  Procedure Laterality Date  . Knee arthroscopy w/ acl reconstruction left knee      right knee arthroscopy at the same time  . Functional endoscopic sinus surgery      scraped sinus    Family History  Problem Relation Age of Onset  . Heart disease Father     afib  Mother has had breast cancer, and sarcoid. 1 Brother in good health works for [employer].  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  Allergies: No Known Allergies  Medications Prior to Admission  Medication Sig Dispense Refill  . clindamycin (CLEOCIN) 300 MG capsule  Take 300 mg by mouth 3 (three) times daily. Started 02/10/13 for 10 day course of therapy.      . clonazePAM (KLONOPIN) 0.5 MG tablet Take 0.25 mg by mouth 2 (two) times daily as needed for anxiety.       Marland Kitchen HYDROcodone-acetaminophen (NORCO/VICODIN) 5-325 MG per tablet Take 2 tablets by mouth every 6 (six) hours as needed for pain.      Marland Kitchen loratadine (CLARITIN) 10 MG tablet Take 10 mg by mouth at bedtime.      . metroNIDAZOLE (FLAGYL) 500 MG tablet Take 500 mg by mouth 2 (two) times daily. 15 day course of therapy started 02/10/13.      . Omega-3 Fatty Acids (FISH OIL) 1200 MG CAPS Take 2 capsules by mouth at bedtime.      . predniSONE (DELTASONE) 10 MG tablet Take 10 mg by mouth daily.      Marland Kitchen testosterone cypionate (DEPOTESTOTERONE CYPIONATE) 100 MG/ML injection Inject 200 mg into the muscle every 7 (seven) days. For IM use only; usually takes Sun, Mon or Tues        No results found for this or any previous visit (from the past 48 hour(s)). No results found.  Review of Systems  Constitutional: Negative.   HENT: Negative.        He's had sinus  surgery what sounds like abscess in the past.  Eyes: Negative.   Respiratory: Negative.   Cardiovascular:       He doesn't have chest pain, but he's had a work up for PAC'S in the past.  Resolved when he stopped taking an antihistamine.  Gastrointestinal: Positive for heartburn (had PUD at age 62, has gotten better.  still has GERD on and off, but can't afford dexilant, take  prilosec or H2 prn). Negative for nausea, vomiting, abdominal pain, diarrhea, constipation, blood in stool and melena.  Genitourinary: Negative.   Musculoskeletal: Negative.   Skin: Negative.        Started having issues 4/15, burning type discomfort, He has long weekend and it was OK till Saturday 4/19, and then it felt like, "sitting on a large baseball."  Neurological: Negative.   Endo/Heme/Allergies: Negative.   Psychiatric/Behavioral: Negative.     Blood pressure 143/82,  pulse 80, temperature 97.8 F (36.6 C), temperature source Oral, resp. rate 18, SpO2 96.00%. Physical Exam  Constitutional: He is oriented to person, place, and time. He appears well-developed and well-nourished. No distress.  Very anxious  HENT:  Head: Normocephalic and atraumatic.  Nose: Nose normal.  Eyes: Conjunctivae and EOM are normal. Pupils are equal, round, and reactive to light. Right eye exhibits no discharge. Left eye exhibits no discharge. No scleral icterus.  Neck: Normal range of motion. Neck supple. No JVD present. No tracheal deviation present. No thyromegaly present.  Cardiovascular: Regular rhythm, normal heart sounds and intact distal pulses.  Exam reveals no gallop.   No murmur heard. Sl tachycardia.  Respiratory: Effort normal and breath sounds normal. No respiratory distress. He has no wheezes. He has no rales. He exhibits no tenderness.  GI: Soft. Bowel sounds are normal. He exhibits no distension and no mass. There is no tenderness. There is no rebound and no guarding.  Genitourinary:  Genitourinary: Rectal exam shows tenderness. Rectal exam shows no external hemorrhoid.           Musculoskeletal: Normal range of motion. He exhibits no edema and no tenderness.  Lymphadenopathy:    He has no cervical adenopathy.  Neurological: He is alert and oriented to person, place, and time. No cranial nerve deficit.  Skin: Skin is warm and dry. He is not diaphoretic.  Psychiatric: He has a normal mood and affect. His behavior is normal. Judgment and thought content normal.  Rather anxious    This portion of exam done in office by Dr. Dwain Sarna   Assessment/Plan 1.  Perirectal abscess left side 2. Hx of anxiety on Klonopin 3.Hx of PUD/GERD 4. Testosterone replacement therapy 5.Tennis elbow, on prednisone.  Plan:  Labs tonight, IV fluids, Zosyn, and plan for surgery in the AM.  Mallory Enriques 02/12/2013, 4:44 PM

## 2013-02-13 ENCOUNTER — Encounter (HOSPITAL_COMMUNITY): Payer: Self-pay | Admitting: Anesthesiology

## 2013-02-13 ENCOUNTER — Encounter (HOSPITAL_COMMUNITY): Admission: AD | Disposition: A | Discharge status: Home / Self Care | Payer: Self-pay | Source: Ambulatory Visit

## 2013-02-13 ENCOUNTER — Inpatient Hospital Stay (HOSPITAL_COMMUNITY): Payer: 59 | Admitting: Anesthesiology

## 2013-02-13 DIAGNOSIS — K612 Anorectal abscess: Secondary | ICD-10-CM

## 2013-02-13 HISTORY — PX: EXAMINATION UNDER ANESTHESIA: SHX1540

## 2013-02-13 LAB — SURGICAL PCR SCREEN: MRSA, PCR: NEGATIVE

## 2013-02-13 SURGERY — EXAM UNDER ANESTHESIA
Anesthesia: General | Site: Rectum | Wound class: Contaminated

## 2013-02-13 MED ORDER — ACETAMINOPHEN 10 MG/ML IV SOLN
1000.0000 mg | Freq: Once | INTRAVENOUS | Status: AC
  Administered 2013-02-13: 1000 mg via INTRAVENOUS

## 2013-02-13 MED ORDER — LIDOCAINE HCL (CARDIAC) 20 MG/ML IV SOLN
INTRAVENOUS | Status: DC | PRN
  Administered 2013-02-13: 100 mg via INTRAVENOUS

## 2013-02-13 MED ORDER — ONDANSETRON HCL 4 MG/2ML IJ SOLN
INTRAMUSCULAR | Status: DC | PRN
  Administered 2013-02-13: 4 mg via INTRAVENOUS

## 2013-02-13 MED ORDER — ENOXAPARIN SODIUM 40 MG/0.4ML ~~LOC~~ SOLN
40.0000 mg | SUBCUTANEOUS | Status: DC
  Filled 2013-02-13: qty 0.4

## 2013-02-13 MED ORDER — PROPOFOL 10 MG/ML IV BOLUS
INTRAVENOUS | Status: DC | PRN
  Administered 2013-02-13: 30 mg via INTRAVENOUS
  Administered 2013-02-13: 20 mg via INTRAVENOUS
  Administered 2013-02-13: 200 mg via INTRAVENOUS

## 2013-02-13 MED ORDER — 0.9 % SODIUM CHLORIDE (POUR BTL) OPTIME
TOPICAL | Status: DC | PRN
  Administered 2013-02-13: 1000 mL

## 2013-02-13 MED ORDER — FENTANYL CITRATE 0.05 MG/ML IJ SOLN
INTRAMUSCULAR | Status: DC | PRN
  Administered 2013-02-13: 100 ug via INTRAVENOUS
  Administered 2013-02-13: 50 ug via INTRAVENOUS

## 2013-02-13 MED ORDER — SODIUM CHLORIDE 0.9 % IJ SOLN
INTRAMUSCULAR | Status: DC | PRN
  Administered 2013-02-13: 20 mL via INTRAVENOUS

## 2013-02-13 MED ORDER — BUPIVACAINE LIPOSOME 1.3 % IJ SUSP
20.0000 mL | Freq: Once | INTRAMUSCULAR | Status: DC
  Filled 2013-02-13: qty 20

## 2013-02-13 MED ORDER — FENTANYL CITRATE 0.05 MG/ML IJ SOLN: 25.0000 ug | INTRAMUSCULAR | Status: DC | PRN

## 2013-02-13 MED ORDER — HYDROCORTISONE SOD SUCCINATE 1000 MG IJ SOLR
INTRAMUSCULAR | Status: DC | PRN
  Administered 2013-02-13: 100 mg via INTRAVENOUS

## 2013-02-13 MED ORDER — LACTATED RINGERS IV SOLN
INTRAVENOUS | Status: DC
  Administered 2013-02-13: 11:00:00 via INTRAVENOUS
  Administered 2013-02-13 (×2): 1000 mL via INTRAVENOUS

## 2013-02-13 MED ORDER — LACTATED RINGERS IV SOLN: INTRAVENOUS | Status: DC

## 2013-02-13 MED ORDER — HYDROMORPHONE HCL PF 1 MG/ML IJ SOLN
1.0000 mg | INTRAMUSCULAR | Status: DC | PRN
  Administered 2013-02-13 (×3): 1 mg via INTRAVENOUS
  Filled 2013-02-13 (×3): qty 1

## 2013-02-13 MED ORDER — MIDAZOLAM HCL 5 MG/5ML IJ SOLN
INTRAMUSCULAR | Status: DC | PRN
  Administered 2013-02-13: 2 mg via INTRAVENOUS

## 2013-02-13 MED ORDER — BUPIVACAINE LIPOSOME 1.3 % IJ SUSP
INTRAMUSCULAR | Status: DC | PRN
  Administered 2013-02-13: 20 mL

## 2013-02-13 SURGICAL SUPPLY — 33 items
BLADE HEX COATED 2.75 (ELECTRODE) ×2 IMPLANT
BLADE SURG 15 STRL LF DISP TIS (BLADE) ×1 IMPLANT
BLADE SURG 15 STRL SS (BLADE) ×1
CLOTH BEACON ORANGE TIMEOUT ST (SAFETY) ×2 IMPLANT
DECANTER SPIKE VIAL GLASS SM (MISCELLANEOUS) ×2 IMPLANT
DRAIN PENROSE 18X1/2 LTX STRL (DRAIN) IMPLANT
DRSG PAD ABDOMINAL 8X10 ST (GAUZE/BANDAGES/DRESSINGS) IMPLANT
ELECT REM PT RETURN 9FT ADLT (ELECTROSURGICAL) ×2
ELECTRODE REM PT RTRN 9FT ADLT (ELECTROSURGICAL) ×1 IMPLANT
GAUZE PACKING IODOFORM 1/4X5 (PACKING) ×2 IMPLANT
GAUZE SPONGE 4X4 16PLY XRAY LF (GAUZE/BANDAGES/DRESSINGS) ×2 IMPLANT
GLOVE BIOGEL PI IND STRL 7.0 (GLOVE) ×1 IMPLANT
GLOVE BIOGEL PI INDICATOR 7.0 (GLOVE) ×1
GOWN STRL NON-REIN LRG LVL3 (GOWN DISPOSABLE) ×2 IMPLANT
GOWN STRL REIN XL XLG (GOWN DISPOSABLE) ×4 IMPLANT
KIT BASIN OR (CUSTOM PROCEDURE TRAY) ×2 IMPLANT
LUBRICANT JELLY K Y 4OZ (MISCELLANEOUS) ×2 IMPLANT
NDL SAFETY ECLIPSE 18X1.5 (NEEDLE) ×1 IMPLANT
NEEDLE HYPO 18GX1.5 SHARP (NEEDLE) ×1
NEEDLE HYPO 22GX1.5 SAFETY (NEEDLE) ×4 IMPLANT
NEEDLE HYPO 25X1 1.5 SAFETY (NEEDLE) ×2 IMPLANT
NS IRRIG 1000ML POUR BTL (IV SOLUTION) ×2 IMPLANT
PACK LITHOTOMY IV (CUSTOM PROCEDURE TRAY) IMPLANT
PENCIL BUTTON HOLSTER BLD 10FT (ELECTRODE) ×2 IMPLANT
SPONGE GAUZE 4X4 12PLY (GAUZE/BANDAGES/DRESSINGS) ×2 IMPLANT
SPONGE SURGIFOAM ABS GEL 12-7 (HEMOSTASIS) IMPLANT
SUT CHROMIC 2 0 SH (SUTURE) IMPLANT
SUT CHROMIC 3 0 SH 27 (SUTURE) IMPLANT
SYR BULB IRRIGATION 50ML (SYRINGE) ×2 IMPLANT
SYR CONTROL 10ML LL (SYRINGE) ×2 IMPLANT
TOWEL OR 17X26 10 PK STRL BLUE (TOWEL DISPOSABLE) ×2 IMPLANT
UNDERPAD 30X30 INCONTINENT (UNDERPADS AND DIAPERS) ×2 IMPLANT
YANKAUER SUCT BULB TIP 10FT TU (MISCELLANEOUS) ×2 IMPLANT

## 2013-02-13 NOTE — Progress Notes (Signed)
  Subjective: His major compliant was having to take heparin.    Objective: Vital signs in last 24 hours: Temp:  [97.7 F (36.5 C)-98.4 F (36.9 C)] 97.7 F (36.5 C) (04/22 0610) Pulse Rate:  [66-84] 68 (04/22 0610) Resp:  [17-18] 18 (04/22 0610) BP: (126-146)/(70-88) 126/70 mmHg (04/22 0610) SpO2:  [94 %-99 %] 97 % (04/22 0610) Weight:  [92.534 kg (204 lb)] 92.534 kg (204 lb) (04/22 0019) Last BM Date: 02/05/13 Afebrile, VSS, labs OK Intake/Output from previous day: 04/21 0701 - 04/22 0700 In: 1374.2 [P.O.:840; I.V.:534.2] Out: 2100 [Urine:2100] Intake/Output this shift:    General appearance: alert, cooperative and no distress Skin: No change.  Lab Results:   Recent Labs  02/12/13 1637  WBC 9.6  HGB 16.7  HCT 49.3  PLT 255    BMET  Recent Labs  02/12/13 1637  NA 138  K 4.5  CL 101  CO2 29  GLUCOSE 98  BUN 14  CREATININE 1.25  CALCIUM 9.5   PT/INR No results found for this basename: LABPROT, INR,  in the last 72 hours   Recent Labs Lab 02/12/13 1637  AST 19  ALT 22  ALKPHOS 42  BILITOT 0.4  PROT 7.4  ALBUMIN 3.9     Lipase     Component Value Date/Time   LIPASE 40 12/20/2010 1619     Studies/Results: No results found.  Medications: . docusate sodium  100 mg Oral BID  . famotidine  20 mg Oral BID  . heparin  5,000 Units Subcutaneous Q8H  . piperacillin-tazobactam (ZOSYN)  IV  3.375 g Intravenous Q8H    Assessment/Plan 1. Perirectal abscess left side  2. Hx of anxiety on Klonopin  3.Hx of PUD/GERD  4. Testosterone replacement therapy  5.Tennis elbow, on prednisone.  Plan:  OR later this AM   LOS: 1 day    Gerardo Caiazzo 02/13/2013

## 2013-02-13 NOTE — Anesthesia Preprocedure Evaluation (Signed)
Anesthesia Evaluation  Patient identified by MRN, date of birth, ID band Patient awake    Reviewed: Allergy & Precautions, H&P , NPO status , Patient's Chart, lab work & pertinent test results  Airway Mallampati: II TM Distance: >3 FB Neck ROM: full    Dental no notable dental hx.    Pulmonary neg pulmonary ROS,  breath sounds clear to auscultation  Pulmonary exam normal       Cardiovascular Exercise Tolerance: Good negative cardio ROS  Rhythm:regular Rate:Normal     Neuro/Psych negative neurological ROS  negative psych ROS   GI/Hepatic negative GI ROS, Neg liver ROS, GERD-  Controlled,  Endo/Other  negative endocrine ROS  Renal/GU negative Renal ROS  negative genitourinary   Musculoskeletal   Abdominal   Peds  Hematology negative hematology ROS (+)   Anesthesia Other Findings   Reproductive/Obstetrics negative OB ROS                           Anesthesia Physical Anesthesia Plan  ASA: II  Anesthesia Plan: General   Post-op Pain Management:    Induction: Intravenous  Airway Management Planned: LMA  Additional Equipment:   Intra-op Plan:   Post-operative Plan:   Informed Consent: I have reviewed the patients History and Physical, chart, labs and discussed the procedure including the risks, benefits and alternatives for the proposed anesthesia with the patient or authorized representative who has indicated his/her understanding and acceptance.   Dental Advisory Given  Plan Discussed with: CRNA and Surgeon  Anesthesia Plan Comments:         Anesthesia Quick Evaluation

## 2013-02-13 NOTE — Interval H&P Note (Signed)
History and Physical Interval Note:  02/13/2013 10:59 AM  Tanner Hatfield  has presented today for surgery, with the diagnosis of Perirectal absecss  The various methods of treatment have been discussed with the patient and family. After consideration of risks, benefits and other options for treatment, the patient has consented to  Procedure(s): with possible Incision and Draniange of Perirectal Abscess (N/A) as a surgical intervention .  The patient's history has been reviewed, patient examined, no change in status, stable for surgery.  I have reviewed the patient's chart and labs.  Questions were answered to the patient's satisfaction.     Salathiel Ferrara A.

## 2013-02-13 NOTE — Care Management Note (Signed)
    Page 1 of 1   02/13/2013     5:00:48 PM   CARE MANAGEMENT NOTE 02/13/2013  Patient:  Tanner Hatfield, Tanner Hatfield   Account Number:  [id number]  Date Initiated:  02/13/2013  Documentation initiated by:  Lorenda Ishihara  Subjective/Objective Assessment:   42 yo male admitted with perirectal abscess, I&D in the OR. PTA lived at home with spouse.     Action/Plan:   Home with spouse.   Anticipated DC Date:  02/16/2013   Anticipated DC Plan:  HOME/SELF CARE      DC Planning Services  CM consult      Choice offered to / List presented to:             Status of service:  Completed, signed off Medicare Important Message given?   (If response is "NO", the following Medicare IM given date fields will be blank) Date Medicare IM given:   Date Additional Medicare IM given:    Discharge Disposition:  HOME/SELF CARE  Per UR Regulation:  Reviewed for med. necessity/level of care/duration of stay  If discussed at Long Length of Stay Meetings, dates discussed:    Comments:

## 2013-02-13 NOTE — Progress Notes (Signed)
OR TODAY.  QUESTIONS ANSWERED.

## 2013-02-13 NOTE — Progress Notes (Signed)
Patient decided to take the scheduled 2200 heparin injection. Pharmacy was notified. Heparin injection given.

## 2013-02-13 NOTE — Transfer of Care (Signed)
Immediate Anesthesia Transfer of Care Note  Patient: Tanner Hatfield  Procedure(s) Performed: Procedure(s):  Incision and Draniange of Perirectal Abscess (N/A)  Patient Location: PACU  Anesthesia Type:General  Level of Consciousness: sedated  Airway & Oxygen Therapy: Patient Spontanous Breathing and Patient connected to face mask oxygen  Post-op Assessment: Report given to PACU RN and Post -op Vital signs reviewed and stable  Post vital signs: Reviewed and stable  Complications: No apparent anesthesia complications

## 2013-02-13 NOTE — H&P (Signed)
OR FOR EUA.  PROBABLE ABSCESS.  DISCUSSED WITH PATIENT.  RISKS OF BLEEDING,  INFECTION,  MORE SURGERY OR INCONTINENCE.

## 2013-02-13 NOTE — Anesthesia Postprocedure Evaluation (Signed)
  Anesthesia Post-op Note  Patient: Tanner Hatfield  Procedure(s) Performed: Procedure(s) (LRB):  Incision and Draniange of Perirectal Abscess (N/A)  Patient Location: PACU  Anesthesia Type: General  Level of Consciousness: awake and alert   Airway and Oxygen Therapy: Patient Spontanous Breathing  Post-op Pain: mild  Post-op Assessment: Post-op Vital signs reviewed, Patient's Cardiovascular Status Stable, Respiratory Function Stable, Patent Airway and No signs of Nausea or vomiting  Last Vitals:  Filed Vitals:   02/13/13 1246  BP: 143/98  Pulse: 72  Temp: 36.9 C  Resp: 16    Post-op Vital Signs: stable   Complications: No apparent anesthesia complications

## 2013-02-13 NOTE — Brief Op Note (Signed)
02/12/2013 - 02/13/2013  11:53 AM  PATIENT:  Tanner Hatfield  42 y.o. male  PRE-OPERATIVE DIAGNOSIS:  Perirectal absecss  POST-OPERATIVE DIAGNOSIS:  PERIRECTAL ABSCESS  PROCEDURE:  Procedure(s):  Incision and Draniange of Perirectal Abscess (N/A)  SURGEON:  Surgeon(s) and Role:    * Mechel Haggard A. Loyda Costin, MD - Primary    ASSISTANTS: none   ANESTHESIA:   local and general  EBL:  Total I/O In: -  Out: 40 [Blood:40]  BLOOD ADMINISTERED:none  DRAINS: none   LOCAL MEDICATIONS USED:  Amount: 40 cc  ml exparel 20  Cc by 20 cc  SPECIMEN:  Source of Specimen:  abscess  DISPOSITION OF SPECIMEN:  micro  COUNTS:  YES   DICTATION: .Other Dictation: Dictation Number [id number]  PLAN OF CARE: Admit to inpatient   PATIENT DISPOSITION:  PACU - hemodynamically stable.   Delay start of Pharmacological VTE agent (>24hrs) due to surgical blood loss or risk of bleeding: no

## 2013-02-14 ENCOUNTER — Encounter (INDEPENDENT_AMBULATORY_CARE_PROVIDER_SITE_OTHER): Payer: Self-pay | Admitting: Internal Medicine

## 2013-02-14 ENCOUNTER — Encounter (HOSPITAL_COMMUNITY): Payer: Self-pay | Admitting: Surgery

## 2013-02-14 MED ORDER — AMOXICILLIN-POT CLAVULANATE 875-125 MG PO TABS: 1.0000 | ORAL_TABLET | Freq: Two times a day (BID) | ORAL | Status: DC

## 2013-02-14 MED ORDER — OXYCODONE HCL 5 MG PO TABS: 5.0000 mg | ORAL_TABLET | ORAL | Status: DC | PRN

## 2013-02-14 NOTE — Op Note (Signed)
NAMEHOVANES, HYMAS NO.:  [id number]  MEDICAL RECORD NO.:  [id number]  LOCATION:  1528                         FACILITY:  [facility]  PHYSICIAN:  Maisie Fus A. Hamzeh Tall, M.D.DATE OF BIRTH:  May 29, 1971  DATE OF PROCEDURE:  02/13/2013 DATE OF DISCHARGE:                              OPERATIVE REPORT   PREOPERATIVE DIAGNOSIS:  Perirectal and perianal pain.  POSTOPERATIVE DIAGNOSIS:  Posterior midline 3-cm perirectal abscess which was intersphincteric.  PROCEDURE:  Incision and drainage of intersphincteric perirectal abscess.  SURGEON:  Maisie Fus A. Valora Norell, M.D.  ANESTHESIA:  LMA with 40 mL of Exparel which is a dilution of 20 mL of Exparel with 20 mL normal saline, as the perianal block.  EBL:  Minimal.  SPECIMEN:  Cultures taken.  INDICATIONS FOR PROCEDURE:  The patient was seen yesterday in our office.  This is a 42 year old male with an 1-week history of perianal pain.  He is too tender to examine in the office.  He was admitted for exam under anesthesia today in the operating room.  I met the patient in holding area.  Discussed the procedure with him and answered all his questions to the best of my ability.  He seems satisfied.  Risk of bleeding, infection, continence, and need for further operation for the fistula formation.  DESCRIPTION OF PROCEDURE:  The patient was then taken from the holding area to the operating room.  He was placed supine where LMA anesthesia was initiated.  He was placed in lithotomy and the anal canal was prepped and draped in sterile fashion.  He was already on Zosyn.  Time- out was done.  Digital examination was done.  This was normal.  The sphincter tone was elevated.  I could not easily palpate a mass, so I took a needle and aspirated posterior to the anus where most of his pain was.  I got a small return and possibly made a small cruciate incision, ending got about 10-15 mL of pus at about 3-cm abscess which was in the posterior  midline and was intersphincteric in between the internal and external sphincter.  I used my finger to open the cavity and hemostat after making a cruciate incision and irrigated out the cavity.  This was packed with quarter-inch iodoform packing and dry dressing was applied. The remainder of the examination was unremarkable.  He did have a small external hemorrhoid, as well, which was thrombosed and took a small scalpel and I went ahead and drain this.  When I did this, there was minimal clot.  This was in the left lateral positions.  The remainder of his examination was normal.  Dry dressings were then applied.  The patient was taken out of lithotomy and taken back to recovery after extubation in satisfactory condition.  All final counts were correct.  EBL:  Less than 100 mL.     Dudley Cooley A. Macyn Remmert, M.D.     TAC/MEDQ  D:  02/13/2013  T:  02/14/2013  Job:  [id number]

## 2013-02-14 NOTE — Discharge Summary (Signed)
  Physician Discharge Summary  Patient ID: Tanner Hatfield MRN: [id number] DOB/AGE: June 25, 1971 42 y.o.  Admit date: 02/12/2013 Discharge date: 02/14/2013  Admitting Diagnosis: Peri-rectal abscess  Discharge Diagnosis Patient Active Problem List   Diagnosis Date Noted  . Groin pain 04/30/2011  . DYSPEPSIA 07/28/2010  . HEARTBURN 07/28/2010  . Personal history of peptic ulcer disease 07/28/2010    Consultants None   Procedures Incision and Draniange of Perirectal Abscess, Dr. Luisa Hart 02/13/13   Hospital Course:  42 yr old male who presented to our office with left buttock pain.  He was found to have a peri-rectal abscess, he was admitted for IV abx and I&D.  He underwent procedure above and tolerated this well.  On POD#1, is pain was controlled with PO meds and he was afebrile.  It was felt that he would be stable for discharge home.  He will do a sitz bath tomorrow and allow his packing to fall out on its own.  He will then do sitz bathes 2-3 times daily.  He will f/u with Dr. Dwain Sarna next week.    Physical Exam at discharge General: awake, alert, NAD Buttocks: dressing clean, no significant bleeding, no significant enduration. VSS afebrile      Medication List    STOP taking these medications       clindamycin 300 MG capsule  Commonly known as:  CLEOCIN     HYDROcodone-acetaminophen 5-325 MG per tablet  Commonly known as:  NORCO/VICODIN      TAKE these medications       amoxicillin-clavulanate 875-125 MG per tablet  Commonly known as:  AUGMENTIN  Take 1 tablet by mouth 2 (two) times daily.     clonazePAM 0.5 MG tablet  Commonly known as:  KLONOPIN  Take 0.25 mg by mouth 2 (two) times daily as needed for anxiety.     Fish Oil 1200 MG Caps  Take 2 capsules by mouth at bedtime.     loratadine 10 MG tablet  Commonly known as:  CLARITIN  Take 10 mg by mouth at bedtime.     metroNIDAZOLE 500 MG tablet  Commonly known as:  FLAGYL  Take 500 mg by mouth 2  (two) times daily. 15 day course of therapy started 02/10/13.     oxyCODONE 5 MG immediate release tablet  Commonly known as:  Oxy IR/ROXICODONE  Take 1-2 tablets (5-10 mg total) by mouth every 4 (four) hours as needed.     predniSONE 10 MG tablet  Commonly known as:  DELTASONE  Take 10 mg by mouth daily.     testosterone cypionate 100 MG/ML injection  Commonly known as:  DEPOTESTOTERONE CYPIONATE  Inject 200 mg into the muscle every 7 (seven) days. For IM use only; usually takes Sun, Mon or Tues             Follow-up Information   Follow up with Emelia Loron, MD. Call in 5 days. (Please call our office to verify your appt day and time.)    Contact information:   [address] [place] [address] [phone]       Signed: Denny Levy [facility] [phone]  02/14/2013, 8:23 AM

## 2013-02-14 NOTE — Discharge Instructions (Signed)
Do sitz bath later today or tomorrow and allow packing to fall out. Do sitz bathes 2-3 times/day with warm soapy water

## 2013-02-14 NOTE — Discharge Summary (Signed)
Stable for discharge

## 2013-02-15 LAB — CULTURE, ROUTINE-ABSCESS

## 2013-02-16 ENCOUNTER — Encounter (INDEPENDENT_AMBULATORY_CARE_PROVIDER_SITE_OTHER): Payer: Self-pay | Admitting: Surgery

## 2013-02-16 ENCOUNTER — Ambulatory Visit (INDEPENDENT_AMBULATORY_CARE_PROVIDER_SITE_OTHER): Payer: 59 | Admitting: Surgery

## 2013-02-16 VITALS — BP 140/82 | HR 80 | Temp 97.5°F | Resp 18 | Ht 74.0 in | Wt 200.8 lb

## 2013-02-16 DIAGNOSIS — K610 Anal abscess: Secondary | ICD-10-CM | POA: Insufficient documentation

## 2013-02-16 DIAGNOSIS — K612 Anorectal abscess: Secondary | ICD-10-CM

## 2013-02-16 NOTE — Progress Notes (Signed)
[facility]  Ovidio Kin, MD,  FACS [address] [place]  [address] [place], [place]    [address] Phone:  [phone] FAX:  [phone]   Re:   Tanner Hatfield DOB:   [birth year] MRN:   [id number]  Urgent Office Note:  Epic is acting up and I don't what this final note will look like.  ASSESSMENT AND PLAN: 1.  Posteror perianl abscess  Seen by Dr. Dwain Sarna - 02/12/2013 - I&D by Dr. Luisa Hart 02/13/2013  Came back to office because of concerns about the packing and the wound.  But I think the wound looks good and he is on a normal post op course.  He has an appt next week with Dr. Dwain Sarna which he will keep.  2. On Prednisone - 10 mg QD for "tennis elbow"  He's been on it about 5 weeks.  He will take 5 mg x 5 days, then stop the Prednisone  We talked about how steroids can influence wound healing  HISTORY OF PRESENT ILLNESS: Chief Complaint  Patient presents with  . Follow-up    raised area close to I&D location    Tanner Hatfield is a 42 y.o. (DOB: [birth year])  white  male who is a patient of GREEN, EDWIN JAY, MD and comes to the urgent office today for follow up of a peri-anal abscess drainage.  He had a anal fissure in 2011 which healed without surgery.  He developed a peri-anal abscess which was seen by Dr. Dwain Sarna - 02/12/2013 - I&D by Dr. Luisa Hart 02/13/2013  He has a loud voice and somewhat disgruntled personality.  Since the I&D of the peri-anal abscess by Dr. Luisa Hart - he says he does not remember Dr. Luisa Hart giving him instructions.  He removed the packing over two episodes.  He said that he cut the packing after her removed part.  He said that there is another knot that has come up.   He comes today to check his wound, make sure all the packing is out, and see if there is another know coming up. I spent my time trying to reassure him that the wound looks good and this should continue to get better.  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  PHYSICAL EXAM: BP 140/82  Pulse 80  Temp(Src) 97.5 F (36.4 C) (Temporal)  Resp 18  Ht 6\' 2"  (1.88 m)  Wt 200 lb 12.8 oz (91.082 kg)  BMI 25.77 kg/m2  Buttocks/anus:  Scar posterior (a little to the left) that is clean, no drainage, and I see no residual packing.  Small (<1 cm) soft thrombosed hemorrhoid on the left anua  DATA REVIEWED: Epic notes.   Ovidio Kin, MD, FACS Office:  [phone]

## 2013-02-18 LAB — ANAEROBIC CULTURE

## 2013-02-20 ENCOUNTER — Ambulatory Visit (INDEPENDENT_AMBULATORY_CARE_PROVIDER_SITE_OTHER): Payer: 59 | Admitting: General Surgery

## 2013-02-20 ENCOUNTER — Encounter (INDEPENDENT_AMBULATORY_CARE_PROVIDER_SITE_OTHER): Payer: Self-pay | Admitting: General Surgery

## 2013-02-20 VITALS — BP 132/80 | HR 91 | Temp 98.6°F | Resp 18 | Ht 74.0 in | Wt 200.0 lb

## 2013-02-20 DIAGNOSIS — Z09 Encounter for follow-up examination after completed treatment for conditions other than malignant neoplasm: Secondary | ICD-10-CM

## 2013-02-20 MED ORDER — HYDROCODONE-ACETAMINOPHEN 10-325 MG PO TABS: 1.0000 | ORAL_TABLET | Freq: Every day | ORAL | Status: AC

## 2013-02-20 NOTE — Progress Notes (Signed)
Subjective:     Patient ID: Tanner Hatfield, male   DOB: [birth year], 42 y.o.   MRN: [id number]  HPI This is a 42 year old male I know very well. I saw him last week and had a perirectal abscess. He was admitted to the hospital at that time and underwent an incision and drainage in the operating room of his perirectal abscess with Dr. Luisa Hart. He was then sent home. His packing came out soon thereafter. He has been doing well. He did develop what looked to be a small thrombosed external hemorrhoid for which she was seen in our office on Friday afternoon as well. Today he returns with really just some mild soreness at the site of the external hemorrhoid but this is getting better. He is doing tub soaks. He is having bowel movements without really any pain or bleeding. He like to go back to work.  Review of Systems     Objective:   Physical Exam Posterior incision clean with no drainage, no infection Small left lateral soft thrombosed resolving external hemorrhoid    Assessment:     S/p I/D perirectal abscess     Plan:     This is all healing well. I told him that hemorrhoid will just resolve and he is going to continue to soak for that. He will come back and see me in 2 weeks. He is going to be returned to work today.

## 2013-03-05 ENCOUNTER — Encounter (INDEPENDENT_AMBULATORY_CARE_PROVIDER_SITE_OTHER): Payer: Self-pay | Admitting: General Surgery

## 2013-03-05 ENCOUNTER — Ambulatory Visit (INDEPENDENT_AMBULATORY_CARE_PROVIDER_SITE_OTHER): Payer: 59 | Admitting: General Surgery

## 2013-03-05 VITALS — BP 122/82 | HR 84 | Resp 18 | Ht 74.0 in | Wt 207.0 lb

## 2013-03-05 DIAGNOSIS — Z09 Encounter for follow-up examination after completed treatment for conditions other than malignant neoplasm: Secondary | ICD-10-CM

## 2013-03-05 NOTE — Progress Notes (Signed)
Subjective:     Patient ID: Tanner Hatfield, male   DOB: 1971/10/14, 42 y.o.   MRN: [id number]  HPI This is a 42 year old male who had a perirectal abscess drained. I saw him last time and is packing of fallen out. Right after I saw him he had drainage of a fair amount of material from that but has been doing well since then. He describes a knot in this area. He is otherwise having bowel movements and doing fine. His only real complaints today are the bilateral elbow pain for which she was on prednisone before.  Review of Systems     Objective:   Physical Exam Healing perirectal wound that has some scar tissue to account for knot but this is healing well with no infection    Assessment:     Status post perirectal abscess drainage.     Plan:     I can see him back as needed. I recommended he go back to his primary care physician or be seen by Dr. Eulah Pont who has seen him before for evaluation of both of his elbows. I do think it reasonable to consider not going back on prednisone.

## 2013-03-27 ENCOUNTER — Other Ambulatory Visit: Payer: Self-pay | Admitting: Orthopedic Surgery

## 2013-03-27 DIAGNOSIS — M545 Low back pain, unspecified: Secondary | ICD-10-CM

## 2013-03-29 ENCOUNTER — Ambulatory Visit
Admission: RE | Admit: 2013-03-29 | Discharge: 2013-03-29 | Disposition: A | Discharge status: Home / Self Care | Payer: 59 | Source: Ambulatory Visit | Attending: Orthopedic Surgery | Admitting: Orthopedic Surgery

## 2013-03-29 DIAGNOSIS — M545 Low back pain, unspecified: Secondary | ICD-10-CM

## 2013-11-02 ENCOUNTER — Ambulatory Visit (INDEPENDENT_AMBULATORY_CARE_PROVIDER_SITE_OTHER): Payer: 59 | Admitting: General Surgery

## 2013-11-02 ENCOUNTER — Encounter (INDEPENDENT_AMBULATORY_CARE_PROVIDER_SITE_OTHER): Payer: Self-pay

## 2013-11-02 ENCOUNTER — Encounter (INDEPENDENT_AMBULATORY_CARE_PROVIDER_SITE_OTHER): Payer: Self-pay | Admitting: General Surgery

## 2013-11-02 VITALS — BP 142/74 | HR 86 | Resp 18 | Ht 75.0 in | Wt 209.0 lb

## 2013-11-02 DIAGNOSIS — K6289 Other specified diseases of anus and rectum: Secondary | ICD-10-CM

## 2013-11-04 NOTE — Progress Notes (Signed)
Subjective:     Patient ID: Tanner Hatfield, male   DOB: [birth year], 43 y.o.   MRN: [id number]  HPI 64 yom who underwent incision and drainage of perianal abscess by Dr Tanner Hatfield last year.  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  Bowel movements are fine without pain. He has prior history of fissure and this does not feel like that.  No brbpr.  No fevers, no drainage he has noted.  He has some hardness and occasional pain and swelling posterior to anus that he would like evaluated.  Review of Systems     Objective:   Physical Exam Rectal exam shows scar posteriorly, no drainage, mild tender to palpation but no mass, no fluctuance I can identify    Assessment:    s/p perianal abscess i/d    Plan:     I think this may just be residual from prior abscess.  There is not any historical or clinical evidence that would make me think there is fistula or abscess now.  We discussed sitz baths and avoiding behaviors that make worse.  We discussed symptoms that would merit concern.  I offered him to see our colorectal surgeon but he declined for now.

## 2015-02-28 ENCOUNTER — Encounter (HOSPITAL_COMMUNITY): Payer: Self-pay

## 2015-02-28 ENCOUNTER — Ambulatory Visit (HOSPITAL_COMMUNITY)
Admission: RE | Admit: 2015-02-28 | Discharge: 2015-02-28 | Disposition: A | Discharge status: Home / Self Care | Payer: 59 | Source: Ambulatory Visit | Attending: Internal Medicine | Admitting: Internal Medicine

## 2015-02-28 DIAGNOSIS — D751 Secondary polycythemia: Secondary | ICD-10-CM | POA: Insufficient documentation

## 2015-02-28 HISTORY — DX: Anxiety disorder, unspecified: F41.9

## 2015-02-28 HISTORY — DX: Secondary polycythemia: D75.1

## 2015-02-28 NOTE — Progress Notes (Signed)
Tanner Hatfield presents today for phlebotomy per MD orders. HGB/HCT per his statement from Dr office " 19.1" Phlebotomy procedure started at 1610and ended at1628  500 cc removed. Patient tolerated procedure well stood a bedside and then ambulated in department IV needle removed intact.

## 2015-02-28 NOTE — Discharge Instructions (Signed)

## 2015-03-10 ENCOUNTER — Other Ambulatory Visit: Payer: Self-pay | Admitting: *Deleted

## 2015-03-10 ENCOUNTER — Other Ambulatory Visit: Payer: Self-pay | Admitting: General Surgery

## 2015-03-10 DIAGNOSIS — K604 Rectal fistula: Secondary | ICD-10-CM

## 2015-03-31 ENCOUNTER — Telehealth: Payer: Self-pay | Admitting: General Surgery

## 2015-03-31 ENCOUNTER — Ambulatory Visit
Admission: RE | Admit: 2015-03-31 | Discharge: 2015-03-31 | Disposition: A | Discharge status: Home / Self Care | Payer: 59 | Source: Ambulatory Visit | Attending: General Surgery | Admitting: General Surgery

## 2015-03-31 MED ORDER — GADOBENATE DIMEGLUMINE 529 MG/ML IV SOLN
19.0000 mL | Freq: Once | INTRAVENOUS | Status: AC | PRN
  Administered 2015-03-31: 19 mL via INTRAVENOUS

## 2015-03-31 NOTE — Telephone Encounter (Signed)
Discussed MRI results with patient.  I have recommended exam under anesthesia and probable fistulotomy.  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  We discussed the typical surgery and aftercare.The patient will call the office when he is ready to get this scheduled.

## 2015-07-09 ENCOUNTER — Other Ambulatory Visit: Payer: Self-pay | Admitting: General Surgery

## 2015-07-09 NOTE — H&P (Signed)
History of Present Illness Tanner Ruff MD; 7/85/8850 12:04 PM) Patient words: perirectal abcess.  The patient is a 44 year old male who presents with a complaint of Rectal bleeding. 44 year old male who per presents to the office with recurrent anal rectal pain. He is status post drainage of a posterior abscess approximately 2 years ago. Since then he has had occasional worsening pain in that region. The last time this happened was 4 months ago. He was seen by Dr. Brantley Stage. There was an area posteriorly that was swollen. An attempt at localization of an abscess was performed using needle aspiration. No abscess could be located. Since that time he only has occasional pain at his perirectal scar. He was referred to me for further evaluation. The patient denies any rectal drainage. He has a history of a anal fissure as well.   Other Problems Mammie Lorenzo, LPN; 2/77/4128 78:67 AM) Gastric Ulcer Gastroesophageal Reflux Disease Sleep Apnea  Past Surgical History Mammie Lorenzo, LPN; 6/72/0947 09:62 AM) Knee Surgery Bilateral. Vasectomy  Diagnostic Studies History Mammie Lorenzo, LPN; 8/36/6294 76:54 AM) Colonoscopy never  Allergies Mammie Lorenzo, LPN; 6/50/3546 56:81 AM) No Known Drug Allergies10/30/2015  Medication History Mammie Lorenzo, LPN; 2/75/1700 17:49 AM) Testosterone Cypionate (200MG /ML Solution, Intramuscular daily) Active. KlonoPIN (0.5MG  Tablet, Oral daily) Active. Fish Oil (daily) Active. Omeprazole (10MG  Capsule DR, Oral daily) Active. Claritin (10MG  Tablet, Oral) Active. Medications Reconciled  Social History Mammie Lorenzo, LPN; 4/49/6759 16:38 AM) Alcohol use Occasional alcohol use. Caffeine use Coffee. Illicit drug use Remotely quit drug use. Tobacco use Former smoker.  Family History Mammie Lorenzo, LPN; 4/66/5993 57:01 AM) Depression Mother. Respiratory Condition Mother.  Review of Systems Claiborne Billings Dockery LPN; 7/79/3903 00:92  AM) General Not Present- Appetite Loss, Chills, Fatigue, Fever, Night Sweats, Weight Gain and Weight Loss. Skin Not Present- Change in Wart/Mole, Dryness, Hives, Jaundice, New Lesions, Non-Healing Wounds, Rash and Ulcer. HEENT Not Present- Earache, Hearing Loss, Hoarseness, Nose Bleed, Oral Ulcers, Ringing in the Ears, Seasonal Allergies, Sinus Pain, Sore Throat, Visual Disturbances, Wears glasses/contact lenses and Yellow Eyes. Respiratory Not Present- Bloody sputum, Chronic Cough, Difficulty Breathing, Snoring and Wheezing. Breast Not Present- Breast Mass, Breast Pain, Nipple Discharge and Skin Changes. Cardiovascular Not Present- Chest Pain, Difficulty Breathing Lying Down, Leg Cramps, Palpitations, Rapid Heart Rate, Shortness of Breath and Swelling of Extremities. Gastrointestinal Not Present- Abdominal Pain, Bloating, Bloody Stool, Change in Bowel Habits, Chronic diarrhea, Constipation, Difficulty Swallowing, Excessive gas, Gets full quickly at meals, Hemorrhoids, Indigestion, Nausea, Rectal Pain and Vomiting. Male Genitourinary Not Present- Blood in Urine, Change in Urinary Stream, Frequency, Impotence, Nocturia, Painful Urination, Urgency and Urine Leakage. Musculoskeletal Not Present- Back Pain, Joint Pain, Joint Stiffness, Muscle Pain, Muscle Weakness and Swelling of Extremities. Neurological Not Present- Decreased Memory, Fainting, Headaches, Numbness, Seizures, Tingling, Tremor, Trouble walking and Weakness. Psychiatric Present- Anxiety. Not Present- Bipolar, Change in Sleep Pattern, Depression, Fearful and Frequent crying. Endocrine Not Present- Cold Intolerance, Excessive Hunger, Hair Changes, Heat Intolerance, Hot flashes and New Diabetes. Hematology Not Present- Easy Bruising, Excessive bleeding, Gland problems, HIV and Persistent Infections.   Vitals   Weight: 210 lb Height: 75in Body Surface Area: 2.25 m Body Mass Index: 26.25 kg/m Temp.: 98.3F(Oral)  Pulse: 79  (Regular)  BP: 152/88 (Sitting, Left Arm, Standard)    Physical Exam  General Mental Status-Alert. General Appearance-Consistent with stated age. Hydration-Well hydrated. Voice-Normal.  Eye Eyeball - Bilateral-Extraocular movements intact. Sclera/Conjunctiva - Bilateral-No scleral icterus.  Rectal Note: 1 cm area 2 cm from anal verge posteriorly  that is mildy tender to palpation. scar from I/D noted no fistula. No underlying mass palpated    Musculoskeletal Normal Exam - Left-Upper Extremity Strength Normal and Lower Extremity Strength Normal. Normal Exam - Right-Upper Extremity Strength Normal and Lower Extremity Strength Normal.    Assessment & Plan  PROCTALGIA (875.79  K62.89) Story: We will call you with the date and time for your MRI. After I get these results, I will call you back. Impression: 44 year old male with a history of a perirectal abscess. He has occasional perianal pain posteriorly at the abscess site. He also has had 2 episodes of swelling and induration in that area which did not open up and drain and resolved on its own. On exam I do not see any signs of a recurrent perirectal abscess. He does not have any symptoms at this time. I recommended an MRI to further evaluate for possible fistula.  MRI showed a grade 4 fistula.

## 2015-08-21 ENCOUNTER — Encounter (HOSPITAL_BASED_OUTPATIENT_CLINIC_OR_DEPARTMENT_OTHER): Payer: Self-pay | Admitting: *Deleted

## 2015-08-21 NOTE — Progress Notes (Signed)
NPO AFTER MN.  ARRIVE AT 0715.  NEEDS HG. WILL TAKE ZANTAC AND KLONOPIN AM DOS W/ SIPS OF WATER.

## 2015-08-28 ENCOUNTER — Ambulatory Visit (HOSPITAL_BASED_OUTPATIENT_CLINIC_OR_DEPARTMENT_OTHER): Payer: 59 | Admitting: Anesthesiology

## 2015-08-28 ENCOUNTER — Encounter (HOSPITAL_BASED_OUTPATIENT_CLINIC_OR_DEPARTMENT_OTHER)
Admission: RE | Disposition: A | Discharge status: Home / Self Care | Payer: Self-pay | Source: Ambulatory Visit | Attending: General Surgery

## 2015-08-28 ENCOUNTER — Ambulatory Visit (HOSPITAL_BASED_OUTPATIENT_CLINIC_OR_DEPARTMENT_OTHER)
Admission: RE | Admit: 2015-08-28 | Discharge: 2015-08-28 | Disposition: A | Discharge status: Home / Self Care | Payer: 59 | Source: Ambulatory Visit | Attending: General Surgery | Admitting: General Surgery

## 2015-08-28 ENCOUNTER — Encounter (HOSPITAL_BASED_OUTPATIENT_CLINIC_OR_DEPARTMENT_OTHER): Payer: Self-pay | Admitting: *Deleted

## 2015-08-28 DIAGNOSIS — K603 Anal fistula: Secondary | ICD-10-CM | POA: Diagnosis not present

## 2015-08-28 DIAGNOSIS — Z79899 Other long term (current) drug therapy: Secondary | ICD-10-CM | POA: Insufficient documentation

## 2015-08-28 DIAGNOSIS — K219 Gastro-esophageal reflux disease without esophagitis: Secondary | ICD-10-CM | POA: Diagnosis not present

## 2015-08-28 DIAGNOSIS — K648 Other hemorrhoids: Secondary | ICD-10-CM | POA: Insufficient documentation

## 2015-08-28 DIAGNOSIS — K625 Hemorrhage of anus and rectum: Secondary | ICD-10-CM | POA: Diagnosis present

## 2015-08-28 DIAGNOSIS — G4730 Sleep apnea, unspecified: Secondary | ICD-10-CM | POA: Diagnosis not present

## 2015-08-28 DIAGNOSIS — Z87891 Personal history of nicotine dependence: Secondary | ICD-10-CM | POA: Insufficient documentation

## 2015-08-28 DIAGNOSIS — K6289 Other specified diseases of anus and rectum: Secondary | ICD-10-CM | POA: Insufficient documentation

## 2015-08-28 HISTORY — DX: Obstructive sleep apnea (adult) (pediatric): G47.33

## 2015-08-28 HISTORY — DX: Personal history of peptic ulcer disease: Z87.11

## 2015-08-28 HISTORY — DX: Personal history of other diseases of the digestive system: Z87.19

## 2015-08-28 HISTORY — DX: Anal fistula, unspecified: K60.30

## 2015-08-28 HISTORY — DX: Testicular hypofunction: E29.1

## 2015-08-28 HISTORY — DX: Anal fistula: K60.3

## 2015-08-28 HISTORY — DX: Unspecified osteoarthritis, unspecified site: M19.90

## 2015-08-28 HISTORY — PX: EVALUATION UNDER ANESTHESIA WITH ANAL FISTULECTOMY: SHX5621

## 2015-08-28 LAB — POCT HEMOGLOBIN-HEMACUE: HEMOGLOBIN: 16.8 g/dL (ref 13.0–17.0)

## 2015-08-28 SURGERY — EXAM UNDER ANESTHESIA WITH ANAL FISTULECTOMY
Anesthesia: General | Site: Anus

## 2015-08-28 MED ORDER — LACTATED RINGERS IV SOLN
INTRAVENOUS | Status: DC
Start: 2015-08-28 — End: 2015-08-28
  Administered 2015-08-28 (×2): via INTRAVENOUS
  Filled 2015-08-28: qty 1000

## 2015-08-28 MED ORDER — SODIUM CHLORIDE 0.9 % IJ SOLN
3.0000 mL | Freq: Two times a day (BID) | INTRAMUSCULAR | Status: DC
  Filled 2015-08-28: qty 3

## 2015-08-28 MED ORDER — ONDANSETRON HCL 4 MG/2ML IJ SOLN
INTRAMUSCULAR | Status: DC | PRN
  Administered 2015-08-28: 4 mg via INTRAVENOUS

## 2015-08-28 MED ORDER — FENTANYL CITRATE (PF) 100 MCG/2ML IJ SOLN
INTRAMUSCULAR | Status: DC | PRN
  Administered 2015-08-28 (×4): 25 ug via INTRAVENOUS
  Administered 2015-08-28: 50 ug via INTRAVENOUS
  Administered 2015-08-28 (×2): 25 ug via INTRAVENOUS

## 2015-08-28 MED ORDER — LIDOCAINE HCL (CARDIAC) 20 MG/ML IV SOLN
INTRAVENOUS | Status: DC | PRN
  Administered 2015-08-28: 100 mg via INTRAVENOUS

## 2015-08-28 MED ORDER — LIDOCAINE 5 % EX OINT
TOPICAL_OINTMENT | CUTANEOUS | Status: DC | PRN
  Administered 2015-08-28: 1 via TOPICAL

## 2015-08-28 MED ORDER — HYDROMORPHONE HCL 1 MG/ML IJ SOLN
0.2500 mg | INTRAMUSCULAR | Status: DC | PRN
  Filled 2015-08-28: qty 1

## 2015-08-28 MED ORDER — MIDAZOLAM HCL 5 MG/5ML IJ SOLN
INTRAMUSCULAR | Status: DC | PRN
  Administered 2015-08-28: 2 mg via INTRAVENOUS
  Administered 2015-08-28 (×2): 1 mg via INTRAVENOUS

## 2015-08-28 MED ORDER — PROPOFOL 10 MG/ML IV BOLUS
INTRAVENOUS | Status: DC | PRN
  Administered 2015-08-28: 100 mg via INTRAVENOUS
  Administered 2015-08-28: 200 mg via INTRAVENOUS

## 2015-08-28 MED ORDER — MIDAZOLAM HCL 2 MG/2ML IJ SOLN
INTRAMUSCULAR | Status: AC
  Filled 2015-08-28: qty 4

## 2015-08-28 MED ORDER — SODIUM CHLORIDE 0.9 % IV SOLN
250.0000 mL | INTRAVENOUS | Status: DC | PRN
  Filled 2015-08-28: qty 250

## 2015-08-28 MED ORDER — OXYCODONE-ACETAMINOPHEN 5-325 MG PO TABS: 1.0000 | ORAL_TABLET | ORAL | Status: AC | PRN

## 2015-08-28 MED ORDER — SODIUM CHLORIDE 0.9 % IJ SOLN
3.0000 mL | INTRAMUSCULAR | Status: DC | PRN
  Filled 2015-08-28: qty 3

## 2015-08-28 MED ORDER — FENTANYL CITRATE (PF) 100 MCG/2ML IJ SOLN
INTRAMUSCULAR | Status: AC
  Filled 2015-08-28: qty 4

## 2015-08-28 MED ORDER — DEXAMETHASONE SODIUM PHOSPHATE 4 MG/ML IJ SOLN
INTRAMUSCULAR | Status: DC | PRN
  Administered 2015-08-28: 10 mg via INTRAVENOUS

## 2015-08-28 MED ORDER — ACETAMINOPHEN 650 MG RE SUPP
650.0000 mg | RECTAL | Status: DC | PRN
  Filled 2015-08-28: qty 1

## 2015-08-28 MED ORDER — SUCCINYLCHOLINE CHLORIDE 20 MG/ML IJ SOLN
INTRAMUSCULAR | Status: DC | PRN
  Administered 2015-08-28: 100 mg via INTRAVENOUS

## 2015-08-28 MED ORDER — PROMETHAZINE HCL 25 MG/ML IJ SOLN
6.2500 mg | INTRAMUSCULAR | Status: DC | PRN
  Filled 2015-08-28: qty 1

## 2015-08-28 MED ORDER — BUPIVACAINE-EPINEPHRINE 0.5% -1:200000 IJ SOLN
INTRAMUSCULAR | Status: DC | PRN
  Administered 2015-08-28: 30 mL

## 2015-08-28 MED ORDER — KETOROLAC TROMETHAMINE 30 MG/ML IJ SOLN
INTRAMUSCULAR | Status: DC | PRN
  Administered 2015-08-28: 30 mg via INTRAVENOUS

## 2015-08-28 MED ORDER — PROMETHAZINE HCL 25 MG/ML IJ SOLN
INTRAMUSCULAR | Status: AC
  Filled 2015-08-28: qty 1

## 2015-08-28 MED ORDER — PROMETHAZINE HCL 25 MG/ML IJ SOLN
6.2500 mg | INTRAMUSCULAR | Status: DC | PRN
  Administered 2015-08-28: 6.25 mg via INTRAVENOUS
  Filled 2015-08-28: qty 1

## 2015-08-28 MED ORDER — OXYCODONE HCL 5 MG PO TABS
5.0000 mg | ORAL_TABLET | ORAL | Status: DC | PRN
  Filled 2015-08-28: qty 2

## 2015-08-28 MED ORDER — ACETAMINOPHEN 325 MG PO TABS
650.0000 mg | ORAL_TABLET | ORAL | Status: DC | PRN
  Filled 2015-08-28: qty 2

## 2015-08-28 SURGICAL SUPPLY — 45 items
BENZOIN TINCTURE PRP APPL 2/3 (GAUZE/BANDAGES/DRESSINGS) ×3 IMPLANT
BLADE HEX COATED 2.75 (ELECTRODE) ×3 IMPLANT
BLADE SURG 15 STRL LF DISP TIS (BLADE) ×2 IMPLANT
BLADE SURG 15 STRL SS (BLADE) ×1
BRIEF STRETCH FOR OB PAD LRG (UNDERPADS AND DIAPERS) ×6 IMPLANT
CANISTER SUCTION 2500CC (MISCELLANEOUS) ×3 IMPLANT
COVER BACK TABLE 60X90IN (DRAPES) ×3 IMPLANT
COVER MAYO STAND STRL (DRAPES) ×3 IMPLANT
DRAPE LG THREE QUARTER DISP (DRAPES) ×3 IMPLANT
DRAPE PED LAPAROTOMY (DRAPES) ×3 IMPLANT
DRAPE UTILITY XL STRL (DRAPES) ×3 IMPLANT
DRSG PAD ABDOMINAL 8X10 ST (GAUZE/BANDAGES/DRESSINGS) ×3 IMPLANT
ELECT REM PT RETURN 9FT ADLT (ELECTROSURGICAL) ×3
ELECTRODE REM PT RTRN 9FT ADLT (ELECTROSURGICAL) ×2 IMPLANT
GLOVE BIO SURGEON STRL SZ 6.5 (GLOVE) ×6 IMPLANT
GLOVE BIO SURGEON STRL SZ7.5 (GLOVE) ×3 IMPLANT
GLOVE BIOGEL PI IND STRL 7.5 (GLOVE) ×2 IMPLANT
GLOVE BIOGEL PI INDICATOR 7.5 (GLOVE) ×1
GLOVE INDICATOR 7.0 STRL GRN (GLOVE) ×6 IMPLANT
GLOVE SURG SS PI 7.5 STRL IVOR (GLOVE) ×3 IMPLANT
GOWN STRL REUS W/ TWL XL LVL3 (GOWN DISPOSABLE) ×2 IMPLANT
GOWN STRL REUS W/TWL 2XL LVL3 (GOWN DISPOSABLE) ×3 IMPLANT
GOWN STRL REUS W/TWL XL LVL3 (GOWN DISPOSABLE) ×1
HYDROGEN PEROXIDE 16OZ (MISCELLANEOUS) ×3 IMPLANT
KIT ROOM TURNOVER WOR (KITS) ×3 IMPLANT
LOOP VESSEL MAXI BLUE (MISCELLANEOUS) IMPLANT
NEEDLE HYPO 25X1 1.5 SAFETY (NEEDLE) ×3 IMPLANT
NS IRRIG 500ML POUR BTL (IV SOLUTION) ×3 IMPLANT
PACK BASIN DAY SURGERY FS (CUSTOM PROCEDURE TRAY) ×3 IMPLANT
PAD ARMBOARD 7.5X6 YLW CONV (MISCELLANEOUS) ×3 IMPLANT
PENCIL BUTTON HOLSTER BLD 10FT (ELECTRODE) ×3 IMPLANT
SPONGE GAUZE 4X4 12PLY STER LF (GAUZE/BANDAGES/DRESSINGS) ×3 IMPLANT
SUT CHROMIC 2 0 SH (SUTURE) ×3 IMPLANT
SUT CHROMIC 3 0 SH 27 (SUTURE) ×3 IMPLANT
SUT ETHIBOND 0 (SUTURE) IMPLANT
SUT SILK 2 0 (SUTURE)
SUT SILK 2-0 18XBRD TIE 12 (SUTURE) IMPLANT
SUT VIC AB 3-0 SH 18 (SUTURE) IMPLANT
SUT VIC AB 4-0 P-3 18XBRD (SUTURE) IMPLANT
SUT VIC AB 4-0 P3 18 (SUTURE)
SYR CONTROL 10ML LL (SYRINGE) ×3 IMPLANT
TOWEL OR 17X24 6PK STRL BLUE (TOWEL DISPOSABLE) ×3 IMPLANT
TRAY DSU PREP LF (CUSTOM PROCEDURE TRAY) ×3 IMPLANT
TUBE CONNECTING 12X1/4 (SUCTIONS) ×3 IMPLANT
YANKAUER SUCT BULB TIP NO VENT (SUCTIONS) ×3 IMPLANT

## 2015-08-28 NOTE — Anesthesia Procedure Notes (Signed)
Procedure Name: Intubation Date/Time: 08/28/2015 9:05 AM Performed by: Justice Rocher Pre-anesthesia Checklist: Patient identified, Emergency Drugs available, Suction available and Patient being monitored Patient Re-evaluated:Patient Re-evaluated prior to inductionOxygen Delivery Method: Circle System Utilized Preoxygenation: Pre-oxygenation with 100% oxygen Intubation Type: IV induction Ventilation: Mask ventilation without difficulty Laryngoscope Size: Mac and 4 Grade View: Grade II Tube type: Oral Tube size: 8.0 mm Number of attempts: 1 Airway Equipment and Method: Stylet and Oral airway Placement Confirmation: ETT inserted through vocal cords under direct vision,  positive ETCO2 and breath sounds checked- equal and bilateral Secured at: 23 cm Tube secured with: Tape Dental Injury: Teeth and Oropharynx as per pre-operative assessment

## 2015-08-28 NOTE — Transfer of Care (Signed)
Immediate Anesthesia Transfer of Care Note  Patient: Tanner Hatfield  Procedure(s) Performed: Procedure(s) (LRB): ANAL EXAM UNDER ANESTHESIA WITH FISTULOTOMY  (N/A)  Patient Location: PACU  Anesthesia Type: General  Level of Consciousness: awake, sedated, patient cooperative and responds to stimulation  Airway & Oxygen Therapy: Patient Spontanous Breathing and Patient connected to face mask oxygen  Post-op Assessment: Report given to PACU RN, Post -op Vital signs reviewed and stable and Patient moving all extremities  Post vital signs: Reviewed and stable  Complications: No apparent anesthesia complications

## 2015-08-28 NOTE — Op Note (Signed)
08/28/2015  9:33 AM  PATIENT:  Tanner Hatfield  44 y.o. male  Patient Care Team: Levin Erp, MD as PCP - General (Internal Medicine)  PRE-OPERATIVE DIAGNOSIS:  Posterior anal fistula  POST-OPERATIVE DIAGNOSIS:  Posterior anal fistula  PROCEDURE:  ANAL EXAM UNDER ANESTHESIA WITH FISTULOTOMY   Surgeon(s): Leighton Ruff, MD  ASSISTANT: none   ANESTHESIA:   local and general  SPECIMEN:  No Specimen  DISPOSITION OF SPECIMEN:  N/A  COUNTS:  YES  PLAN OF CARE: Discharge to home after PACU  PATIENT DISPOSITION:  PACU - hemodynamically stable.  INDICATION: 44 y.o. 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  DESCRIPTION: the patient was identified in the preoperative holding area and taken to the OR where they were laid on the operating room table.  General anesthesia was induced without difficulty. The patient was then positioned in prone jackknife position with buttocks gently taped apart.  The patient was then prepped and draped in usual sterile fashion.  SCDs were noted to be in place prior to the initiation of anesthesia. A surgical timeout was performed indicating the correct patient, procedure, positioning and need for preoperative antibiotics.  A rectal block was performed using Marcaine with epinephrine.    I began with a digital rectal exam.  There were no abnormalities palpated.  The external opening could be seen at posterior midline.  I then placed a Hill-Ferguson anoscope into the anal canal and evaluated this completely.  There was minimal internal hemorrhoid disease.  The internal opening could be visualized at posterior midline at the dentate line.  I placed an S shaped fistula probe into the external opening and this passed into the internal opening. The fistula tract seem to involve only the superficial portion of the external sphincter. I decided  to perform a fistulotomy. This was performed using electrocautery. The edges of the tract were marsupialized using a 2-0 chromic suture. I placed the remaining Marcaine around the operative site. Lidocaine ointment and a sterile dressing was applied. The patient tolerated this well. He was awakened from anesthesia and sent to the postanesthesia care unit in stable condition. All counts were correct per operating room staff.

## 2015-08-28 NOTE — H&P (Signed)
History of Present Illness  Patient words: perirectal abcess.  The patient is a 44 year old male who presents with a complaint of Rectal bleeding. 44 year old male who per presents to the office with recurrent anal rectal pain. He is status post drainage of a posterior abscess approximately 2 years ago. Since then he has had occasional worsening pain in that region. The last time this happened was 4 months ago. He was seen by Dr. Brantley Stage. There was an area posteriorly that was swollen. An attempt at localization of an abscess was performed using needle aspiration. No abscess could be located. Since that time he only has occasional pain at his perirectal scar. He was referred to me for further evaluation. The patient denies any rectal drainage. He has a history of a anal fissure as well.   Other Problems Mammie Lorenzo, LPN; 8/46/6599 35:70 AM) Gastric Ulcer Gastroesophageal Reflux Disease Sleep Apnea  Past Surgical History Mammie Lorenzo, LPN; 1/77/9390 30:09 AM) Knee Surgery Bilateral. Vasectomy  Diagnostic Studies History Mammie Lorenzo, LPN; 2/33/0076 22:63 AM) Colonoscopy never  Allergies Mammie Lorenzo, LPN; 3/35/4562 56:38 AM) No Known Drug Allergies10/30/2015  Medication History Mammie Lorenzo, LPN; 9/37/3428 76:81 AM) Testosterone Cypionate (200MG /ML Solution, Intramuscular daily) Active. KlonoPIN (0.5MG  Tablet, Oral daily) Active. Fish Oil (daily) Active. Omeprazole (10MG  Capsule DR, Oral daily) Active. Claritin (10MG  Tablet, Oral) Active. Medications Reconciled  Social History Mammie Lorenzo, LPN; 1/57/2620 35:59 AM) Alcohol use Occasional alcohol use. Caffeine use Coffee. Illicit drug use Remotely quit drug use. Tobacco use Former smoker.  Family History Mammie Lorenzo, LPN; 7/41/6384 53:64 AM) Depression Mother. Respiratory Condition Mother.  Review of Systems General Not Present- Appetite Loss, Chills, Fatigue, Fever, Night Sweats,  Weight Gain and Weight Loss. Skin Not Present- Change in Wart/Mole, Dryness, Hives, Jaundice, New Lesions, Non-Healing Wounds, Rash and Ulcer. HEENT Not Present- Earache, Hearing Loss, Hoarseness, Nose Bleed, Oral Ulcers, Ringing in the Ears, Seasonal Allergies, Sinus Pain, Sore Throat, Visual Disturbances, Wears glasses/contact lenses and Yellow Eyes. Respiratory Not Present- Bloody sputum, Chronic Cough, Difficulty Breathing, Snoring and Wheezing. Breast Not Present- Breast Mass, Breast Pain, Nipple Discharge and Skin Changes. Cardiovascular Not Present- Chest Pain, Difficulty Breathing Lying Down, Leg Cramps, Palpitations, Rapid Heart Rate, Shortness of Breath and Swelling of Extremities. Gastrointestinal Not Present- Abdominal Pain, Bloating, Bloody Stool, Change in Bowel Habits, Chronic diarrhea, Constipation, Difficulty Swallowing, Excessive gas, Gets full quickly at meals, Hemorrhoids, Indigestion, Nausea, Rectal Pain and Vomiting. Male Genitourinary Not Present- Blood in Urine, Change in Urinary Stream, Frequency, Impotence, Nocturia, Painful Urination, Urgency and Urine Leakage. Musculoskeletal Not Present- Back Pain, Joint Pain, Joint Stiffness, Muscle Pain, Muscle Weakness and Swelling of Extremities. Neurological Not Present- Decreased Memory, Fainting, Headaches, Numbness, Seizures, Tingling, Tremor, Trouble walking and Weakness. Psychiatric Present- Anxiety. Not Present- Bipolar, Change in Sleep Pattern, Depression, Fearful and Frequent crying. Endocrine Not Present- Cold Intolerance, Excessive Hunger, Hair Changes, Heat Intolerance, Hot flashes and New Diabetes. Hematology Not Present- Easy Bruising, Excessive bleeding, Gland problems, HIV and Persistent Infections.   BP 144/93 mmHg  Pulse 89  Temp(Src) 98.7 F (37.1 C) (Oral)  Resp 16  Ht 6\' 3"  (1.905 m)  Wt 89.812 kg (198 lb)  BMI 24.75 kg/m2  SpO2 100%   Physical Exam  General Mental Status-Alert. General  Appearance-Consistent with stated age. Hydration-Well hydrated. Voice-Normal. Eye - Sclera/Conjunctiva - Bilateral-No scleral icterus.  CV: RRR  Lungs: CTA  GI: soft, nontender  Rectal Note: 1 cm area 2 cm from anal verge posteriorly that is mildy  tender to palpation. scar from I/D noted no obvious fistula. No underlying mass palpated  Musculoskeletal Normal Exam - Left-Upper Extremity Strength Normal and Lower Extremity Strength Normal. Normal Exam - Right-Upper Extremity Strength Normal and Lower Extremity Strength Normal.    Assessment & Plan  PROCTALGIA (280.03  K62.89) Story: We will call you with the date and time for your MRI. After I get these results, I will call you back. Impression: 45 year old male with a history of a perirectal abscess. He has occasional perianal pain posteriorly at the abscess site. He also has had 2 episodes of swelling and induration in that area which did not open up and drain and resolved on its own. On exam I do not see any signs of a recurrent perirectal abscess. He does not have any symptoms at this time. I recommended an MRI to further evaluate for possible fistula. 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  I have recommended anal EUA and possible fistulotomy vs seton placement.  We have discussed the risks of the surgery, which are bleeding and pain.  If a fistulotomy is performed, he will have a slight risk of fecal leakage.  If a seton is placed, he will need an additional procedure to attempt closure.    Rosario Adie, MD  [facility]

## 2015-08-28 NOTE — Anesthesia Preprocedure Evaluation (Addendum)
Anesthesia Evaluation  Patient identified by MRN, date of birth, ID band Patient awake    Reviewed: Allergy & Precautions, NPO status , Patient's Chart, lab work & pertinent test results  History of Anesthesia Complications Negative for: history of anesthetic complications  Airway Mallampati: II  TM Distance: >3 FB Neck ROM: Full    Dental no notable dental hx.    Pulmonary sleep apnea , former smoker,    Pulmonary exam normal        Cardiovascular negative cardio ROS Normal cardiovascular exam     Neuro/Psych PSYCHIATRIC DISORDERS Anxiety negative neurological ROS     GI/Hepatic Neg liver ROS, GERD  ,  Endo/Other  negative endocrine ROS  Renal/GU negative Renal ROS     Musculoskeletal   Abdominal   Peds  Hematology   Anesthesia Other Findings   Reproductive/Obstetrics                          Anesthesia Physical Anesthesia Plan  ASA: II  Anesthesia Plan: General   Post-op Pain Management:    Induction: Intravenous  Airway Management Planned: LMA  Additional Equipment:   Intra-op Plan:   Post-operative Plan: Extubation in OR  Informed Consent: I have reviewed the patients History and Physical, chart, labs and discussed the procedure including the risks, benefits and alternatives for the proposed anesthesia with the patient or authorized representative who has indicated his/her understanding and acceptance.   Dental advisory given  Plan Discussed with: CRNA, Anesthesiologist and Surgeon  Anesthesia Plan Comments:       Anesthesia Quick Evaluation

## 2015-08-28 NOTE — Anesthesia Postprocedure Evaluation (Signed)
Anesthesia Post Note  Patient: Tanner Hatfield  Procedure(s) Performed: Procedure(s) (LRB): ANAL EXAM UNDER ANESTHESIA WITH FISTULOTOMY  (N/A)  Anesthesia type: general  Patient location: PACU  Post pain: Pain level controlled  Post assessment: Patient's Cardiovascular Status Stable  Last Vitals:  Filed Vitals:   08/28/15 1030  BP: 129/87  Pulse: 70  Temp:   Resp: 16    Post vital signs: Reviewed and stable  Level of consciousness: sedated  Complications: No apparent anesthesia complications

## 2015-08-28 NOTE — Discharge Instructions (Addendum)

## 2015-08-29 ENCOUNTER — Encounter (HOSPITAL_BASED_OUTPATIENT_CLINIC_OR_DEPARTMENT_OTHER): Payer: Self-pay | Admitting: General Surgery

## 2015-11-11 ENCOUNTER — Encounter: Payer: Self-pay | Admitting: Internal Medicine

## 2016-08-05 DIAGNOSIS — D751 Secondary polycythemia: Secondary | ICD-10-CM | POA: Diagnosis not present

## 2016-08-05 DIAGNOSIS — I10 Essential (primary) hypertension: Secondary | ICD-10-CM | POA: Diagnosis not present

## 2016-08-05 DIAGNOSIS — Z23 Encounter for immunization: Secondary | ICD-10-CM | POA: Diagnosis not present

## 2016-09-18 DIAGNOSIS — J0100 Acute maxillary sinusitis, unspecified: Secondary | ICD-10-CM | POA: Diagnosis not present

## 2016-09-18 DIAGNOSIS — J209 Acute bronchitis, unspecified: Secondary | ICD-10-CM | POA: Diagnosis not present

## 2016-10-29 DIAGNOSIS — J040 Acute laryngitis: Secondary | ICD-10-CM | POA: Diagnosis not present

## 2016-11-08 DIAGNOSIS — I10 Essential (primary) hypertension: Secondary | ICD-10-CM | POA: Diagnosis not present

## 2016-11-08 DIAGNOSIS — I739 Peripheral vascular disease, unspecified: Secondary | ICD-10-CM | POA: Diagnosis not present

## 2017-01-08 DIAGNOSIS — J019 Acute sinusitis, unspecified: Secondary | ICD-10-CM | POA: Diagnosis not present

## 2017-03-03 DIAGNOSIS — A35 Other tetanus: Secondary | ICD-10-CM | POA: Diagnosis not present

## 2017-03-03 DIAGNOSIS — I10 Essential (primary) hypertension: Secondary | ICD-10-CM | POA: Diagnosis not present

## 2017-03-03 DIAGNOSIS — E291 Testicular hypofunction: Secondary | ICD-10-CM | POA: Diagnosis not present

## 2017-03-03 DIAGNOSIS — D751 Secondary polycythemia: Secondary | ICD-10-CM | POA: Diagnosis not present

## 2017-03-03 DIAGNOSIS — Z0000 Encounter for general adult medical examination without abnormal findings: Secondary | ICD-10-CM | POA: Diagnosis not present

## 2017-05-16 DIAGNOSIS — M542 Cervicalgia: Secondary | ICD-10-CM | POA: Diagnosis not present

## 2017-05-26 DIAGNOSIS — Z87891 Personal history of nicotine dependence: Secondary | ICD-10-CM | POA: Diagnosis not present

## 2017-05-26 DIAGNOSIS — R51 Headache: Secondary | ICD-10-CM | POA: Diagnosis not present

## 2017-05-26 DIAGNOSIS — Z8709 Personal history of other diseases of the respiratory system: Secondary | ICD-10-CM | POA: Diagnosis not present

## 2017-06-07 DIAGNOSIS — R51 Headache: Secondary | ICD-10-CM | POA: Diagnosis not present

## 2017-06-07 DIAGNOSIS — J329 Chronic sinusitis, unspecified: Secondary | ICD-10-CM | POA: Diagnosis not present

## 2017-10-10 DIAGNOSIS — D751 Secondary polycythemia: Secondary | ICD-10-CM | POA: Diagnosis not present

## 2017-10-10 DIAGNOSIS — E291 Testicular hypofunction: Secondary | ICD-10-CM | POA: Diagnosis not present

## 2017-10-10 DIAGNOSIS — J019 Acute sinusitis, unspecified: Secondary | ICD-10-CM | POA: Diagnosis not present

## 2017-12-13 DIAGNOSIS — J329 Chronic sinusitis, unspecified: Secondary | ICD-10-CM | POA: Diagnosis not present

## 2018-02-08 DIAGNOSIS — E291 Testicular hypofunction: Secondary | ICD-10-CM | POA: Diagnosis not present

## 2018-02-08 DIAGNOSIS — C44319 Basal cell carcinoma of skin of other parts of face: Secondary | ICD-10-CM | POA: Diagnosis not present

## 2018-02-08 DIAGNOSIS — M5030 Other cervical disc degeneration, unspecified cervical region: Secondary | ICD-10-CM | POA: Diagnosis not present

## 2018-02-08 DIAGNOSIS — E781 Pure hyperglyceridemia: Secondary | ICD-10-CM | POA: Diagnosis not present

## 2018-03-10 DIAGNOSIS — J411 Mucopurulent chronic bronchitis: Secondary | ICD-10-CM | POA: Diagnosis not present

## 2018-03-17 DIAGNOSIS — I10 Essential (primary) hypertension: Secondary | ICD-10-CM | POA: Diagnosis not present

## 2018-03-17 DIAGNOSIS — D2239 Melanocytic nevi of other parts of face: Secondary | ICD-10-CM | POA: Diagnosis not present

## 2018-03-17 DIAGNOSIS — C4440 Unspecified malignant neoplasm of skin of scalp and neck: Secondary | ICD-10-CM | POA: Diagnosis not present

## 2018-04-10 DIAGNOSIS — E291 Testicular hypofunction: Secondary | ICD-10-CM | POA: Diagnosis not present

## 2018-04-10 DIAGNOSIS — D229 Melanocytic nevi, unspecified: Secondary | ICD-10-CM | POA: Diagnosis not present

## 2018-11-04 DIAGNOSIS — R05 Cough: Secondary | ICD-10-CM | POA: Diagnosis not present

## 2018-11-08 DIAGNOSIS — N390 Urinary tract infection, site not specified: Secondary | ICD-10-CM | POA: Diagnosis not present

## 2019-01-08 DIAGNOSIS — J0100 Acute maxillary sinusitis, unspecified: Secondary | ICD-10-CM | POA: Diagnosis not present

## 2019-04-03 DIAGNOSIS — F419 Anxiety disorder, unspecified: Secondary | ICD-10-CM | POA: Diagnosis not present

## 2019-04-03 DIAGNOSIS — K279 Peptic ulcer, site unspecified, unspecified as acute or chronic, without hemorrhage or perforation: Secondary | ICD-10-CM | POA: Diagnosis not present

## 2019-04-03 DIAGNOSIS — K649 Unspecified hemorrhoids: Secondary | ICD-10-CM | POA: Diagnosis not present

## 2019-04-03 DIAGNOSIS — I10 Essential (primary) hypertension: Secondary | ICD-10-CM | POA: Diagnosis not present

## 2021-10-15 ENCOUNTER — Other Ambulatory Visit: Payer: Self-pay | Admitting: Urology

## 2021-10-15 DIAGNOSIS — R972 Elevated prostate specific antigen [PSA]: Secondary | ICD-10-CM

## 2021-11-20 ENCOUNTER — Other Ambulatory Visit: Payer: Self-pay

## 2021-11-20 ENCOUNTER — Ambulatory Visit
Admission: RE | Admit: 2021-11-20 | Discharge: 2021-11-20 | Disposition: A | Discharge status: Home / Self Care | Payer: Managed Care, Other (non HMO) | Source: Ambulatory Visit | Attending: Urology | Admitting: Urology

## 2021-11-20 DIAGNOSIS — R972 Elevated prostate specific antigen [PSA]: Secondary | ICD-10-CM

## 2021-11-20 MED ORDER — GADOBENATE DIMEGLUMINE 529 MG/ML IV SOLN
20.0000 mL | Freq: Once | INTRAVENOUS | Status: AC | PRN
  Administered 2021-11-20: 20 mL via INTRAVENOUS

## 2022-09-09 IMAGING — MR MR PROSTATE WO/W CM
12 series · 48 of 48 positions shown · IV contrast (multihance)
Comparison: None.

CLINICAL DATA: Elevated PSA.

EXAM:
MR PROSTATE WITHOUT AND WITH CONTRAST
TECHNIQUE: Multiplanar multisequence MRI images were obtained of the pelvis
centered about the prostate. Pre and post contrast images were
obtained.
CONTRAST:  20mL MULTIHANCE GADOBENATE DIMEGLUMINE 529 MG/ML IV SOLN

[Series 3: T2 · coronal · 3.0mm · 0.56mm/px · 1 of 23 slices shown (1 of 3)]
[im 1/23]
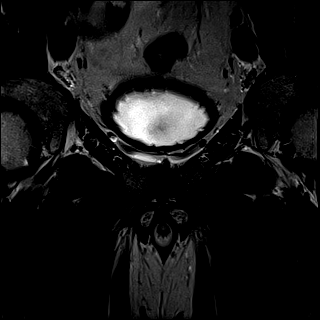

[Series 4: T1 · axial · 5.0mm · 1.25mm/px · 1 of 80 slices shown]
[im 1/80]
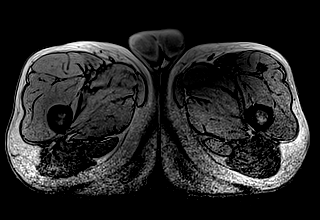

[Series 5: DWI · axial · 3.0mm · 1.75mm/px · z∈[-7,+50]mm · 2 of 60 slices shown (1 of 3)]
[im 1/60]
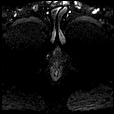
[im 60/60]
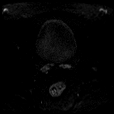

[Series 6: DWI · axial · 3.0mm · 1.75mm/px · 1 of 20 slices shown (2 of 3)]
[im 1/20]
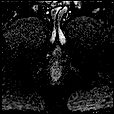

[Series 7: DWI · axial · 3.0mm · 1.75mm/px · 1 of 20 slices shown (3 of 3)]
[im 1/20]
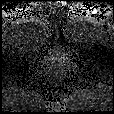

[Series 8: T2 · axial · 3.0mm · 0.56mm/px · 1 of 20 slices shown (2 of 3)]
[im 1/20]
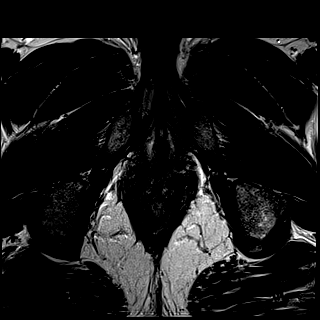

[Series 9: T2 · axial · 1.0mm · 1.04mm/px · z∈[-14,+57]mm · 2 of 72 slices shown (3 of 3)]
[im 1/72]
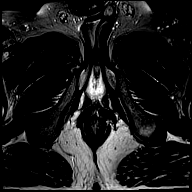
[im 72/72]
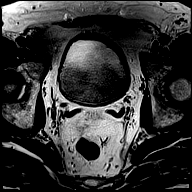

[Series 10: pre t1_twist_tra_dyn · axial · non-contrast · 3.5mm · 0.83mm/px · 1 of 20 slices shown]
[im 1/20]
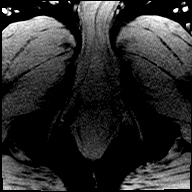

[Series 11: post t1_twist_tra_dyn-copy center · axial · non-contrast · 3.5mm · 0.83mm/px · z∈[-12,+55]mm · 17 of 600 slices shown]
[im 1/600]
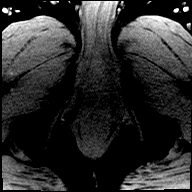
[im 38/600]
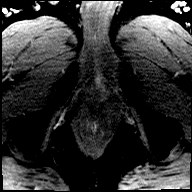
[im 75/600]
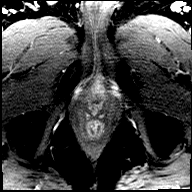
[im 113/600]
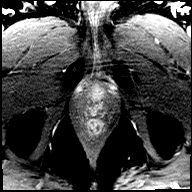
[im 150/600]
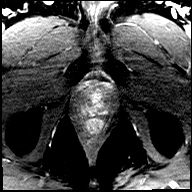
[im 188/600]
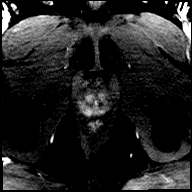
[im 225/600]
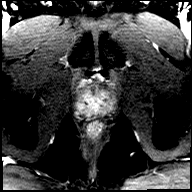
[im 263/600]
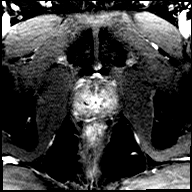
[im 300/600]
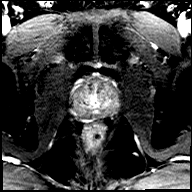
[im 337/600]
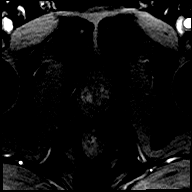
[im 375/600]
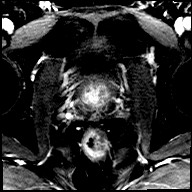
[im 412/600]
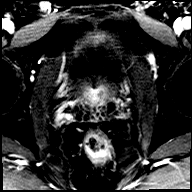
[im 450/600]
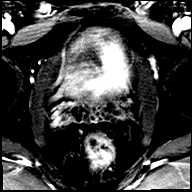
[im 487/600]
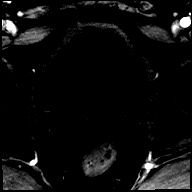
[im 525/600]
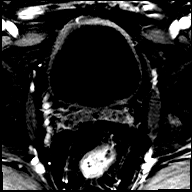
[im 562/600]
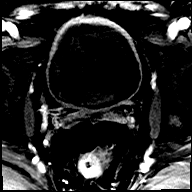
[im 600/600]
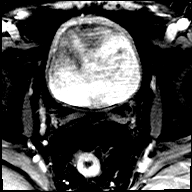

[Series 12: post t1_twist_tra_dyn-copy cent_sub · axial · 3.5mm · 0.83mm/px · z∈[-12,+55]mm · 17 of 573 slices shown]
[im 1/573]
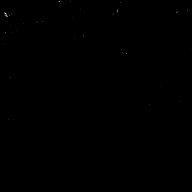
[im 36/573]
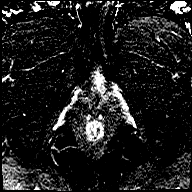
[im 72/573]
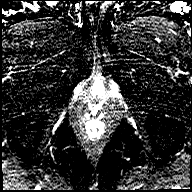
[im 108/573]
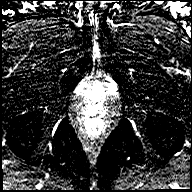
[im 144/573]
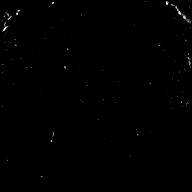
[im 179/573]
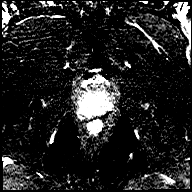
[im 215/573]
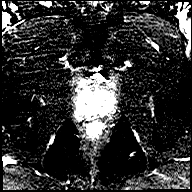
[im 251/573]
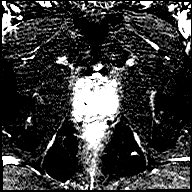
[im 287/573]
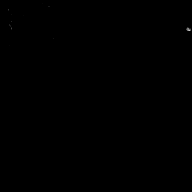
[im 322/573]
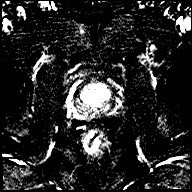
[im 358/573]
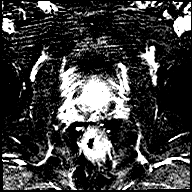
[im 394/573]
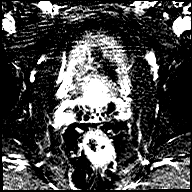
[im 430/573]
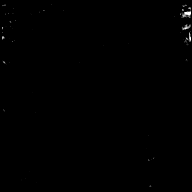
[im 465/573]
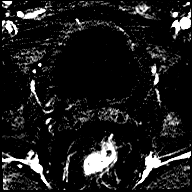
[im 501/573]
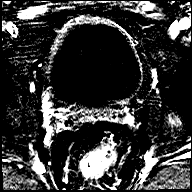
[im 537/573]
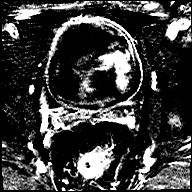
[im 573/573]
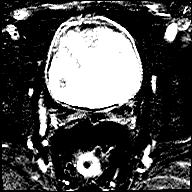

[Series 13: t1_vibe_dixon_tra_f · axial · 2.5mm · 0.91mm/px · z∈[-77,+120]mm · 2 of 79 slices shown]
[im 1/79]
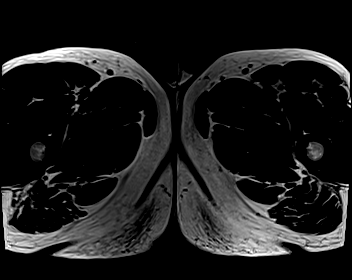
[im 79/79]
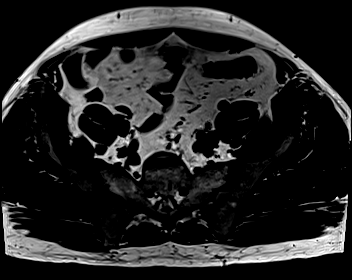

[Series 14: t1_vibe_dixon_tra_w · axial · 2.5mm · 0.91mm/px · z∈[-77,+120]mm · 2 of 80 slices shown]
[im 1/80]
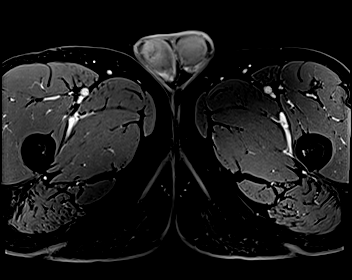
[im 80/80]
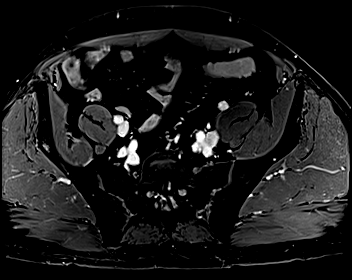

[48 of 48 positions shown; findings below may reference images not displayed]

FINDINGS: Prostate:

-- Peripheral Zone: Asymmetric thinning of the left peripheral zone
is seen, consistent with scarring.a Focal ill-defined area of T2
hypointensity is seen in the left posterior and lateral mid gland,
measuring approximately 13 x 8 mm on image 39/9. This shows focal
mild-to-moderate ADC hypointensity, but no evidence of DWI
hyperintensity or early focal contrast enhancement. PI-RADS 3

-- Transition/Central Zone: Mildly enlarged with encapsulated BPH
nodules. No suspicious nodules identified on T2-weighted or
diffusion sequences.

-- Measurements/Volume:  3.5 by 3.2 x 4.0 cm (volume = 23 cm^3)

Transcapsular spread:  Absent

Seminal vesicle involvement:  Absent

Neurovascular bundle involvement:  Absent

Pelvic adenopathy: None visualized

Bone metastasis: None visualized

Other:  None
IMPRESSION: 13 mm ill-defined peripheral zone nodule in the left posterior and
lateral mid gland, indeterminate. PI-RADS 3 (v2.1): Intermediate
(clinically significant cancer equivocal)

No evidence of extracapsular extension or pelvic metastatic disease.

(I have post-processed this exam in the [employer] application for
potential fusion-guided biopsy.)
